# Patient Record
Sex: Female | Born: 1990 | Hispanic: No | Marital: Single | Smoking: Never smoker
Health system: Southern US, Community
[De-identification: ages and names within clinical notes are randomized; demographics above are authoritative.]

## PROBLEM LIST (undated history)

## (undated) DIAGNOSIS — F419 Anxiety disorder, unspecified: Secondary | ICD-10-CM

## (undated) DIAGNOSIS — T7840XA Allergy, unspecified, initial encounter: Secondary | ICD-10-CM

## (undated) DIAGNOSIS — F32A Depression, unspecified: Secondary | ICD-10-CM

## (undated) DIAGNOSIS — L309 Dermatitis, unspecified: Secondary | ICD-10-CM

## (undated) DIAGNOSIS — F329 Major depressive disorder, single episode, unspecified: Secondary | ICD-10-CM

## (undated) HISTORY — DX: Major depressive disorder, single episode, unspecified: F32.9

## (undated) HISTORY — DX: Anxiety disorder, unspecified: F41.9

## (undated) HISTORY — DX: Dermatitis, unspecified: L30.9

## (undated) HISTORY — DX: Allergy, unspecified, initial encounter: T78.40XA

## (undated) HISTORY — DX: Depression, unspecified: F32.A

## (undated) HISTORY — PX: NO PAST SURGERIES: SHX2092

---
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
## 2014-08-30 ENCOUNTER — Ambulatory Visit (INDEPENDENT_AMBULATORY_CARE_PROVIDER_SITE_OTHER): Payer: BLUE CROSS/BLUE SHIELD | Admitting: Family Medicine

## 2014-08-30 VITALS — BP 110/76 | HR 100 | Temp 97.9°F | Resp 16 | Ht 63.5 in | Wt 128.0 lb

## 2014-08-30 DIAGNOSIS — J309 Allergic rhinitis, unspecified: Secondary | ICD-10-CM

## 2014-08-30 DIAGNOSIS — J029 Acute pharyngitis, unspecified: Secondary | ICD-10-CM

## 2014-08-30 DIAGNOSIS — R05 Cough: Secondary | ICD-10-CM

## 2014-08-30 DIAGNOSIS — R059 Cough, unspecified: Secondary | ICD-10-CM

## 2014-08-30 DIAGNOSIS — J069 Acute upper respiratory infection, unspecified: Secondary | ICD-10-CM

## 2014-08-30 DIAGNOSIS — J3089 Other allergic rhinitis: Secondary | ICD-10-CM

## 2014-08-30 LAB — POCT RAPID STREP A (OFFICE): RAPID STREP A SCREEN: NEGATIVE

## 2014-08-30 MED ORDER — BENZONATATE 100 MG PO CAPS: 100.0000 mg | ORAL_CAPSULE | Freq: Three times a day (TID) | ORAL | Status: DC | PRN

## 2014-08-30 MED ORDER — HYDROCODONE-HOMATROPINE 5-1.5 MG/5ML PO SYRP: 5.0000 mL | ORAL_SOLUTION | ORAL | Status: DC | PRN

## 2014-08-30 NOTE — Progress Notes (Signed)
Subjective:  Patient is here with a respiratory tract infection for the last 3 days or so. She's been coughing a lot. Coughs day and night. She is not wheezing any. She has not been running any documented fevers. She has a sore throat also. Has not been exposed to anybody with strep that she knows of. She does not smoke.  She does have a problem with chronic itching of her years and nose and throat. Has tried fluticasone sprays in the past. Was going to see an allergist but never did.  Last menstrual cycle was now. She is basically healthy lady. 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 SHE ALSO WORKS AT [employer].  OBJECTIVE: NO ACUTE DISTRESS. COUGHING A LOT. TMS NORMAL. THROAT MILDLY ERYTHEMATOUS. STREP TEST WAS TAKEN. NECK SUPPLE WITHOUT SIGNIFICANT NODES. CHEST IS CLEAR TO AUSCULTATION. HEART REGULAR WITHOUT MURMURS.  ASSESSMENT: PHARYNGITIS UPPER RESPIRATORY INFECTION COUGH PERENNIAL ALLERGIC RHINITIS  PLAN: Strep test   Results for orders placed or performed in visit on 08/30/14  POCT rapid strep A  Result Value Ref Range   Rapid Strep A Screen Negative Negative   See discharge instructions and orders.

## 2014-08-30 NOTE — Patient Instructions (Addendum)
Drink plenty of fluids and get enough rest  You probably are infectious to others still for a few more days, so stay out of other people's faces and practice good handwashing  Take the Tessalon (benzonatate) 1 or 2 every 6 or 8 hours as needed for cough  Take the Hycodan cough syrup 1 teaspoon every 4-6 hours as needed for nighttime cough. It will make you to sleepy in the daytime.  Stay out of work for the next couple of days  Advise taking Allegra (fexofenadine) one daily for allergic itching of the throat and ears  Return if further problems  Upper Respiratory Infection, Adult An upper respiratory infection (URI) is also sometimes known as the common cold. The upper respiratory tract includes the nose, sinuses, throat, trachea, and bronchi. Bronchi are the airways leading to the lungs. Most people improve within 1 week, but symptoms can last up to 2 weeks. A residual cough may last even longer.  CAUSES Many different viruses can infect the tissues lining the upper respiratory tract. The tissues become irritated and inflamed and often become very moist. Mucus production is also common. A cold is contagious. You can easily spread the virus to others by oral contact. This includes kissing, sharing a glass, coughing, or sneezing. Touching your mouth or nose and then touching a surface, which is then touched by another person, can also spread the virus. SYMPTOMS  Symptoms typically develop 1 to 3 days after you come in contact with a cold virus. Symptoms vary from person to person. They may include:  Runny nose.  Sneezing.  Nasal congestion.  Sinus irritation.  Sore throat.  Loss of voice (laryngitis).  Cough.  Fatigue.  Muscle aches.  Loss of appetite.  Headache.  Low-grade fever. DIAGNOSIS  You might diagnose your own cold based on familiar symptoms, since most people get a cold 2 to 3 times a year. Your caregiver can confirm this based on your exam. Most importantly,  your caregiver can check that your symptoms are not due to another disease such as strep throat, sinusitis, pneumonia, asthma, or epiglottitis. Blood tests, throat tests, and X-rays are not necessary to diagnose a common cold, but they may sometimes be helpful in excluding other more serious diseases. Your caregiver will decide if any further tests are required. RISKS AND COMPLICATIONS  You may be at risk for a more severe case of the common cold if you smoke cigarettes, have chronic heart disease (such as heart failure) or lung disease (such as asthma), or if you have a weakened immune system. The very young and very old are also at risk for more serious infections. Bacterial sinusitis, middle ear infections, and bacterial pneumonia can complicate the common cold. The common cold can worsen asthma and chronic obstructive pulmonary disease (COPD). Sometimes, these complications can require emergency medical care and may be life-threatening. PREVENTION  The best way to protect against getting a cold is to practice good hygiene. Avoid oral or hand contact with people with cold symptoms. Wash your hands often if contact occurs. There is no clear evidence that vitamin C, vitamin E, echinacea, or exercise reduces the chance of developing a cold. However, it is always recommended to get plenty of rest and practice good nutrition. TREATMENT  Treatment is directed at relieving symptoms. There is no cure. Antibiotics are not effective, because the infection is caused by a virus, not by bacteria. Treatment may include:  Increased fluid intake. Sports drinks offer valuable electrolytes, sugars, and fluids.  Breathing heated mist or steam (vaporizer or shower).  Eating chicken soup or other clear broths, and maintaining good nutrition.  Getting plenty of rest.  Using gargles or lozenges for comfort.  Controlling fevers with ibuprofen or acetaminophen as directed by your caregiver.  Increasing usage of your  inhaler if you have asthma. Zinc gel and zinc lozenges, taken in the first 24 hours of the common cold, can shorten the duration and lessen the severity of symptoms. Pain medicines may help with fever, muscle aches, and throat pain. A variety of non-prescription medicines are available to treat congestion and runny nose. Your caregiver can make recommendations and may suggest nasal or lung inhalers for other symptoms.  HOME CARE INSTRUCTIONS   Only take over-the-counter or prescription medicines for pain, discomfort, or fever as directed by your caregiver.  Use a warm mist humidifier or inhale steam from a shower to increase air moisture. This may keep secretions moist and make it easier to breathe.  Drink enough water and fluids to keep your urine clear or pale yellow.  Rest as needed.  Return to work when your temperature has returned to normal or as your caregiver advises. You may need to stay home longer to avoid infecting others. You can also use a face mask and careful hand washing to prevent spread of the virus. SEEK MEDICAL CARE IF:   After the first few days, you feel you are getting worse rather than better.  You need your caregiver's advice about medicines to control symptoms.  You develop chills, worsening shortness of breath, or brown or red sputum. These may be signs of pneumonia.  You develop yellow or brown nasal discharge or pain in the face, especially when you bend forward. These may be signs of sinusitis.  You develop a fever, swollen neck glands, pain with swallowing, or white areas in the back of your throat. These may be signs of strep throat. SEEK IMMEDIATE MEDICAL CARE IF:   You have a fever.  You develop severe or persistent headache, ear pain, sinus pain, or chest pain.  You develop wheezing, a prolonged cough, cough up blood, or have a change in your usual mucus (if you have chronic lung disease).  You develop sore muscles or a stiff neck. Document  Released: 12/10/2000 Document Revised: 09/08/2011 Document Reviewed: 09/21/2013 [facility] Patient Information 2015 [place], [place]. This information is not intended to replace advice given to you by your health care provider. Make sure you discuss any questions you have with your health care provider.

## 2014-09-14 ENCOUNTER — Ambulatory Visit: Payer: Self-pay | Admitting: Internal Medicine

## 2015-03-14 ENCOUNTER — Ambulatory Visit (INDEPENDENT_AMBULATORY_CARE_PROVIDER_SITE_OTHER): Payer: BLUE CROSS/BLUE SHIELD | Admitting: Family

## 2015-03-14 ENCOUNTER — Encounter: Payer: Self-pay | Admitting: Family

## 2015-03-14 VITALS — BP 124/80 | HR 101 | Temp 98.3°F | Resp 18 | Ht 63.5 in | Wt 134.0 lb

## 2015-03-14 DIAGNOSIS — J302 Other seasonal allergic rhinitis: Secondary | ICD-10-CM | POA: Diagnosis not present

## 2015-03-14 DIAGNOSIS — Z23 Encounter for immunization: Secondary | ICD-10-CM | POA: Diagnosis not present

## 2015-03-14 DIAGNOSIS — F418 Other specified anxiety disorders: Secondary | ICD-10-CM

## 2015-03-14 DIAGNOSIS — F329 Major depressive disorder, single episode, unspecified: Secondary | ICD-10-CM

## 2015-03-14 DIAGNOSIS — L309 Dermatitis, unspecified: Secondary | ICD-10-CM | POA: Diagnosis not present

## 2015-03-14 DIAGNOSIS — F419 Anxiety disorder, unspecified: Secondary | ICD-10-CM | POA: Insufficient documentation

## 2015-03-14 DIAGNOSIS — F32A Depression, unspecified: Secondary | ICD-10-CM

## 2015-03-14 MED ORDER — CLONAZEPAM 0.5 MG PO TABS: 0.2500 mg | ORAL_TABLET | Freq: Two times a day (BID) | ORAL | Status: DC | PRN

## 2015-03-14 NOTE — Progress Notes (Signed)
Pre visit review using our clinic review tool, if applicable. No additional management support is needed unless otherwise documented below in the visit note. 

## 2015-03-14 NOTE — Patient Instructions (Signed)
Thank you for choosing [facility].  Summary/Instructions:  Your prescription(s) have been submitted to your pharmacy or been printed and provided for you. Please take as directed and contact our office if you believe you are having problem(s) with the medication(s) or have any questions.  If your symptoms worsen or fail to improve, please contact our office for further instruction, or in case of emergency go directly to the emergency room at the closest medical facility.   For your arm use the cream prescribed by dermatology and also any Aveeno, Dove or Eucerin product to retain moistures.   For your allergies take Allegra or Zyrtec daily. Add flonase, nasacort or rhinocort as needed.

## 2015-03-14 NOTE — Assessment & Plan Note (Signed)
Symptoms and exam consistent with eczema. Continue previously prescribed steroid cream. Start over the counter moisturizers to maintain hydration of skin. Follow up if symptoms worsen or fail to improve.

## 2015-03-14 NOTE — Progress Notes (Signed)
Subjective:    Patient ID: Ellen Whitney, female    DOB: 10-06-1990, 24 y.o.   MRN: [id number]  Chief Complaint  Patient presents with  . Establish Care    allergies, feels like there is fluid in her ears and her throat and ears itch all the time, sometimes certain foods trigger it and she has eczema    HPI:  Ellen Whitney is a 24 y.o. female with a PMH of seasonal allergies, eczema, and anxiety and depression who presents today for an office visit to establish care.   1.) Seasonal allergies - Associated symptom of seasonal allergies have been going on for years and is triggered by food and evironmental factors. Modifying factors include Zyrtec and Allegra which does help with her symptoms when she takes it.   2.) Eczema - Associated symptom of a rash located on her right forearm that has been going on for about a month. She has been by dermatology and has been using a prescribed cream. Notes that there has been been some improvement.   3.) Depression and anxiety - Associated symptoms of not wanting to get out of bed and lack of drive waxes and wanes. She was previously taking Zoloft and notes that it made her sick. Expresses that she has had increased anxiety which has progressively increased lately. Time is the only modifying factor that makes it better.    No Known Allergies   Outpatient Prescriptions Prior to Visit  Medication Sig Dispense Refill  . benzonatate (TESSALON) 100 MG capsule Take 1-2 capsules (100-200 mg total) by mouth 3 (three) times daily as needed. 30 capsule 0  . HYDROcodone-homatropine (HYCODAN) 5-1.5 MG/5ML syrup Take 5 mLs by mouth every 4 (four) hours as needed. 120 mL 0   No facility-administered medications prior to visit.     Past Medical History  Diagnosis Date  . Allergy   . Eczema   . Anxiety   . Depression      History reviewed. No pertinent past surgical history.   Family History  Problem Relation Age of Onset  . Cancer Maternal  Grandmother   . Healthy Mother      Social History   Social History  . Marital Status: Single    Spouse Name: N/A  . Number of Children: 0  . Years of Education: 16   Occupational History  . 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 Smoking status: Never Smoker   . Smokeless tobacco: Never Used  . Alcohol Use: No  . Drug Use: No  . Sexual Activity: Yes    Birth Control/ Protection: None   Other Topics Concern  . Not on file   Social History Narrative   Fun: Workout, hangout with friends   Denies religious beliefs effecting health care.   Denies abuse and feels safe where she lives.      Review of Systems  Constitutional: Negative for fever and chills.  Eyes: Negative for itching.  Respiratory: Negative for chest tightness and shortness of breath.   Cardiovascular: Negative for chest pain, palpitations and leg swelling.  Skin: Positive for rash.  Psychiatric/Behavioral: Negative for sleep disturbance. The patient is nervous/anxious.       Objective:    BP 124/80 mmHg  Pulse 101  Temp(Src) 98.3 F (36.8 C) (Oral)  Resp 18  Ht 5' 3.5" (1.613 m)  Wt 134 lb (60.782 kg)  BMI 23.36 kg/m2  SpO2 98%  Nursing note and vital signs reviewed.  Physical Exam  Constitutional: She is oriented to person, place, and time. She appears well-developed and well-nourished. No distress.  Cardiovascular: Normal rate, regular rhythm, normal heart sounds and intact distal pulses.   Pulmonary/Chest: Effort normal and breath sounds normal.  Neurological: She is alert and oriented to person, place, and time.  Skin: Skin is warm and dry.  Approximately 1 cm x 1.5 cm brown, raised oval shaped area, with scales and feels like sandpaper is located on her right forearm.   Psychiatric: Her behavior is normal. Judgment and thought content normal. Her mood appears anxious.       Assessment & Plan:   Problem List Items Addressed This Visit       Respiratory   Seasonal allergies - Primary    Seasonal allergies are labile with second generation antihistamine as needed. Discussed continual daily dosing of second generation antihistamine and add nasal corticosteroid as needed. Follow up if symptoms are no controlled with this regimen.         Musculoskeletal and Integument   Eczema    Symptoms and exam consistent with eczema. Continue previously prescribed steroid cream. Start over the counter moisturizers to maintain hydration of skin. Follow up if symptoms worsen or fail to improve.         Other   Anxiety and depression    Symptoms consistent with anxiety and possible underlying depression. Failed treatment with Zoloft secondary to side effects. Start clonazepam as needed for anxiety. Follow up in one month to determine effectiveness and possible need for antidepressive medications.       Relevant Medications   clonazePAM (KLONOPIN) 0.5 MG tablet    Other Visit Diagnoses    Encounter for immunization        Need for diphtheria-tetanus-pertussis (Tdap) vaccine, adult/adolescent        Relevant Orders    Tdap vaccine greater than or equal to 7yo IM (Completed)

## 2015-03-14 NOTE — Assessment & Plan Note (Signed)
Seasonal allergies are labile with second generation antihistamine as needed. Discussed continual daily dosing of second generation antihistamine and add nasal corticosteroid as needed. Follow up if symptoms are no controlled with this regimen.

## 2015-03-14 NOTE — Assessment & Plan Note (Signed)
Symptoms consistent with anxiety and possible underlying depression. Failed treatment with Zoloft secondary to side effects. Start clonazepam as needed for anxiety. Follow up in one month to determine effectiveness and possible need for antidepressive medications.

## 2015-03-27 ENCOUNTER — Telehealth: Payer: Self-pay | Admitting: Family

## 2015-03-27 MED ORDER — MONTELUKAST SODIUM 10 MG PO TABS: 10.0000 mg | ORAL_TABLET | Freq: Every day | ORAL | Status: DC

## 2015-03-27 NOTE — Telephone Encounter (Signed)
Patient called and states her medication doesn't seem to be working for her. Tammy Sours stated that if it didn't work that he would try another medication. Can you please call her to go over this. She is also asking if there is any counseling she can go to.

## 2015-03-27 NOTE — Addendum Note (Signed)
Addended by: Jeanine Luz D on: 03/27/2015 11:54 PM   Modules accepted: Orders

## 2015-03-27 NOTE — Telephone Encounter (Signed)
Singulair sent to pharmacy. I would recommend for the quickest access to counseling to go through [employer]. If she wants to go outside the school I am happy to place a referral.

## 2015-03-28 NOTE — Telephone Encounter (Signed)
Pt was needing something to replace klonopin

## 2015-03-29 MED ORDER — DULOXETINE HCL 30 MG PO CPEP: 30.0000 mg | ORAL_CAPSULE | Freq: Every day | ORAL | Status: DC

## 2015-03-29 MED ORDER — ALPRAZOLAM 0.25 MG PO TABS: 0.2500 mg | ORAL_TABLET | Freq: Two times a day (BID) | ORAL | Status: DC | PRN

## 2015-03-29 NOTE — Telephone Encounter (Signed)
Xanax printed and signed. Follow up in 1 month.

## 2015-03-29 NOTE — Telephone Encounter (Signed)
LVM for pt to call back.

## 2015-03-29 NOTE — Addendum Note (Signed)
Addended by: Jeanine Luz D on: 03/29/2015 12:52 PM   Modules accepted: Orders, Medications

## 2015-03-29 NOTE — Addendum Note (Signed)
Addended by: Jeanine Luz D on: 03/29/2015 05:39 PM   Modules accepted: Orders

## 2015-03-29 NOTE — Telephone Encounter (Signed)
Medication sent to pharmacy. Please instruct her to only take the Klonopin as needed for anxiety as it does not help with depression. Have her follow up in 1 month.Marland Kitchen

## 2015-03-29 NOTE — Telephone Encounter (Signed)
Pt would like something for depression. She preferably would like cymbalta bc that's what her mom and sister takes for depression. She says the clonazapam works great and she wants to stay on it but she takes 2 a day to help.

## 2015-03-30 NOTE — Telephone Encounter (Signed)
Pt aware.

## 2015-04-17 ENCOUNTER — Ambulatory Visit: Payer: BLUE CROSS/BLUE SHIELD | Admitting: Family

## 2015-04-26 ENCOUNTER — Ambulatory Visit (INDEPENDENT_AMBULATORY_CARE_PROVIDER_SITE_OTHER): Payer: BLUE CROSS/BLUE SHIELD | Admitting: Family

## 2015-04-26 ENCOUNTER — Encounter: Payer: Self-pay | Admitting: Family

## 2015-04-26 VITALS — BP 118/70 | HR 88 | Temp 98.4°F | Ht 63.5 in | Wt 131.5 lb

## 2015-04-26 DIAGNOSIS — F419 Anxiety disorder, unspecified: Principal | ICD-10-CM

## 2015-04-26 DIAGNOSIS — F32A Depression, unspecified: Secondary | ICD-10-CM

## 2015-04-26 DIAGNOSIS — F418 Other specified anxiety disorders: Secondary | ICD-10-CM | POA: Diagnosis not present

## 2015-04-26 DIAGNOSIS — F329 Major depressive disorder, single episode, unspecified: Secondary | ICD-10-CM

## 2015-04-26 MED ORDER — CLONAZEPAM 0.5 MG PO TABS: 0.5000 mg | ORAL_TABLET | Freq: Two times a day (BID) | ORAL | Status: DC | PRN

## 2015-04-26 MED ORDER — SERTRALINE HCL 50 MG PO TABS: 50.0000 mg | ORAL_TABLET | Freq: Every day | ORAL | Status: DC

## 2015-04-26 NOTE — Patient Instructions (Addendum)
Thank you for choosing [facility].  Summary/Instructions:  Your prescription(s) have been submitted to your pharmacy or been printed and provided for you. Please take as directed and contact our office if you believe you are having problem(s) with the medication(s) or have any questions.  If your symptoms worsen or fail to improve, please contact our office for further instruction, or in case of emergency go directly to the emergency room at the closest medical facility.    Stress and Stress Management Stress is a normal reaction to life events. It is what you feel when life demands more than you are used to or more than you can handle. Some stress can be useful. For example, the stress reaction can help you catch the last bus of the day, study for a test, or meet a deadline at work. But stress that occurs too often or for too long can cause problems. It can affect your emotional health and interfere with relationships and normal daily activities. Too much stress can weaken your immune system and increase your risk for physical illness. If you already have a medical problem, stress can make it worse. CAUSES  All sorts of life events may cause stress. An event that causes stress for one person may not be stressful for another person. Major life events commonly cause stress. These may be positive or negative. Examples include losing your job, moving into a new home, getting married, having a baby, or losing a loved one. Less obvious life events may also cause stress, especially if they occur day after day or in combination. Examples include working long hours, driving in traffic, caring for children, being in debt, or being in a difficult relationship. SIGNS AND SYMPTOMS Stress may cause emotional symptoms including, the following:  Anxiety. This is feeling worried, afraid, on edge, overwhelmed, or out of control.  Anger. This is feeling irritated or impatient.  Depression. This is feeling  sad, down, helpless, or guilty.  Difficulty focusing, remembering, or making decisions. Stress may cause physical symptoms, including the following:   Aches and pains. These may affect your head, neck, back, stomach, or other areas of your body.  Tight muscles or clenched jaw.  Low energy or trouble sleeping. Stress may cause unhealthy behaviors, including the following:   Eating to feel better (overeating) or skipping meals.  Sleeping too little, too much, or both.  Working too much or putting off tasks (procrastination).  Smoking, drinking alcohol, or using drugs to feel better. DIAGNOSIS  Stress is diagnosed through an assessment by your health care provider. Your health care provider will ask questions about your symptoms and any stressful life events.Your health care provider will also ask about your medical history and may order blood tests or other tests. Certain medical conditions and medicine can cause physical symptoms similar to stress. Mental illness can cause emotional symptoms and unhealthy behaviors similar to stress. Your health care provider may refer you to a mental health professional for further evaluation.  TREATMENT  Stress management is the recommended treatment for stress.The goals of stress management are reducing stressful life events and coping with stress in healthy ways.  Techniques for reducing stressful life events include the following:  Stress identification. Self-monitor for stress and identify what causes stress for you. These skills may help you to avoid some stressful events.  Time management. Set your priorities, keep a calendar of events, and learn to say "no." These tools can help you avoid making too many commitments. Techniques for   coping with stress include the following:  Rethinking the problem. Try to think realistically about stressful events rather than ignoring them or overreacting. Try to find the positives in a stressful situation  rather than focusing on the negatives.  Exercise. Physical exercise can release both physical and emotional tension. The key is to find a form of exercise you enjoy and do it regularly.  Relaxation techniques. These relax the body and mind. Examples include yoga, meditation, tai chi, biofeedback, deep breathing, progressive muscle relaxation, listening to music, being out in nature, journaling, and other hobbies. Again, the key is to find one or more that you enjoy and can do regularly.  Healthy lifestyle. Eat a balanced diet, get plenty of sleep, and do not smoke. Avoid using alcohol or drugs to relax.  Strong support network. Spend time with family, friends, or other people you enjoy being around.Express your feelings and talk things over with someone you trust. Counseling or talktherapy with a mental health professional may be helpful if you are having difficulty managing stress on your own. Medicine is typically not recommended for the treatment of stress.Talk to your health care provider if you think you need medicine for symptoms of stress. HOME CARE INSTRUCTIONS  Keep all follow-up visits as directed by your health care provider.  Take all medicines as directed by your health care provider. SEEK MEDICAL CARE IF:  Your symptoms get worse or you start having new symptoms.  You feel overwhelmed by your problems and can no longer manage them on your own. SEEK IMMEDIATE MEDICAL CARE IF:  You feel like hurting yourself or someone else.   This information is not intended to replace advice given to you by your health care provider. Make sure you discuss any questions you have with your health care provider.   Document Released: 12/10/2000 Document Revised: 07/07/2014 Document Reviewed: 02/08/2013 Elsevier Interactive Patient Education [employer].

## 2015-04-26 NOTE — Assessment & Plan Note (Signed)
Anxiety appears stable with questionable depression. Continue current dosage of clonazepam. Start sertraline for depression. Instructed to seek emergency care if thoughts of suicide develop with new medication. Follow up in 1 month or sooner if needed. Refer to psychology for counseling to assist with management of anxiety and depression.

## 2015-04-26 NOTE — Progress Notes (Signed)
   Subjective:    Patient ID: Ellen Whitney, female    DOB: [birth year], 24 y.o.   MRN: [id number]  Chief Complaint  Patient presents with  . Follow-up    medication follow up (cymbalta - not taken since 04/18/2015)     HPI:  Ellen Whitney is a 24 y.o. female who  has a past medical history of Allergy; Eczema; Anxiety; and Depression. and presents today for a follow up office visit.    1.) Anxiety and depression - Previously started on Cymbalta and clonazepam for anxiety and depression. Noted mild adverse effect of nausea with the Cymbalta for which she discontinued taking the medication. She called and requested to switch from clonazepam to alprazolam, however never had the alprazolam filled. Takes the clonazepam as prescribed and indicates that it does help with her symptoms. Does not take the clonazepam everyday. She has concerns for management of her depression and would like referral to counseling. Denies suicidal ideations.  No Known Allergies   Current Outpatient Prescriptions on File Prior to Visit  Medication Sig Dispense Refill  . montelukast (SINGULAIR) 10 MG tablet Take 1 tablet (10 mg total) by mouth at bedtime. 30 tablet 3   No current facility-administered medications on file prior to visit.    Review of Systems  Constitutional: Negative for fever and chills.  Psychiatric/Behavioral: Negative for dysphoric mood. The patient is nervous/anxious.       Objective:    BP 118/70 mmHg  Pulse 88  Temp(Src) 98.4 F (36.9 C) (Oral)  Ht 5' 3.5" (1.613 m)  Wt 131 lb 8 oz (59.648 kg)  BMI 22.93 kg/m2  SpO2 98%  LMP 04/17/2015 Nursing note and vital signs reviewed.  Physical Exam  Constitutional: She is oriented to person, place, and time. She appears well-developed and well-nourished. No distress.  Cardiovascular: Normal rate, regular rhythm, normal heart sounds and intact distal pulses.   Pulmonary/Chest: Effort normal and breath sounds normal.  Neurological: She is  alert and oriented to person, place, and time.  Skin: Skin is warm and dry.  Psychiatric: Her behavior is normal. Judgment and thought content normal. Her mood appears anxious. She does not exhibit a depressed mood.       Assessment & Plan:   Problem List Items Addressed This Visit      Other   Anxiety and depression - Primary    Anxiety appears stable with questionable depression. Continue current dosage of clonazepam. Start sertraline for depression. Instructed to seek emergency care if thoughts of suicide develop with new medication. Follow up in 1 month or sooner if needed. Refer to psychology for counseling to assist with management of anxiety and depression.       Relevant Medications   clonazePAM (KLONOPIN) 0.5 MG tablet   sertraline (ZOLOFT) 50 MG tablet   Other Relevant Orders   Ambulatory referral to Psychology

## 2015-05-14 ENCOUNTER — Telehealth: Payer: Self-pay | Admitting: Family

## 2015-05-14 DIAGNOSIS — F32A Depression, unspecified: Secondary | ICD-10-CM

## 2015-05-14 DIAGNOSIS — F4323 Adjustment disorder with mixed anxiety and depressed mood: Secondary | ICD-10-CM

## 2015-05-14 DIAGNOSIS — F329 Major depressive disorder, single episode, unspecified: Secondary | ICD-10-CM

## 2015-05-14 DIAGNOSIS — F419 Anxiety disorder, unspecified: Secondary | ICD-10-CM

## 2015-05-14 NOTE — Addendum Note (Signed)
Addended by: Mercer PodWRENN, Nevia Henkin E on: 05/14/2015 04:42 PM   Modules accepted: Orders

## 2015-05-14 NOTE — Telephone Encounter (Signed)
Thought the referral went through. New referral has been placed.

## 2015-05-14 NOTE — Telephone Encounter (Signed)
Patient is calling to follow up on psychology referral that she was told she would get on 04/26/2015 visit. Per referrals tab, i do not see anything to correspond with the visit notes. Please take a look and advise patient.

## 2015-05-14 NOTE — Telephone Encounter (Signed)
The referral was placed on 10/27. What is the status of the referral?

## 2015-05-15 NOTE — Telephone Encounter (Signed)
Tried to call pt back. VM was full.

## 2015-05-28 ENCOUNTER — Ambulatory Visit: Payer: BLUE CROSS/BLUE SHIELD | Admitting: Psychology

## 2015-05-31 ENCOUNTER — Ambulatory Visit (INDEPENDENT_AMBULATORY_CARE_PROVIDER_SITE_OTHER): Payer: BLUE CROSS/BLUE SHIELD | Admitting: Psychology

## 2015-05-31 DIAGNOSIS — F331 Major depressive disorder, recurrent, moderate: Secondary | ICD-10-CM | POA: Diagnosis not present

## 2015-06-01 ENCOUNTER — Ambulatory Visit: Payer: BLUE CROSS/BLUE SHIELD | Admitting: Psychology

## 2015-06-06 ENCOUNTER — Telehealth: Payer: Self-pay | Admitting: Family

## 2015-06-06 ENCOUNTER — Encounter: Payer: Self-pay | Admitting: Internal Medicine

## 2015-06-06 ENCOUNTER — Ambulatory Visit (INDEPENDENT_AMBULATORY_CARE_PROVIDER_SITE_OTHER): Payer: BLUE CROSS/BLUE SHIELD | Admitting: Internal Medicine

## 2015-06-06 VITALS — BP 114/76 | HR 95 | Temp 98.9°F | Resp 18 | Wt 131.0 lb

## 2015-06-06 DIAGNOSIS — J0100 Acute maxillary sinusitis, unspecified: Secondary | ICD-10-CM

## 2015-06-06 MED ORDER — AMOXICILLIN-POT CLAVULANATE 875-125 MG PO TABS: 1.0000 | ORAL_TABLET | Freq: Two times a day (BID) | ORAL | Status: DC

## 2015-06-06 MED ORDER — FLUCONAZOLE 150 MG PO TABS: 150.0000 mg | ORAL_TABLET | Freq: Once | ORAL | Status: DC

## 2015-06-06 NOTE — Progress Notes (Signed)
Subjective:    Patient ID: Ellen Whitney, female    DOB: [birth year], 24 y.o.   MRN: [id number]  HPI  She is just completing milligrams for bacterial infection. Her eye has significantly improved.  Several days ago she started to experience symptoms. She states fatigue, body aches, sore throat, nasal congestion, rhinorrhea. She has tried some over-the-counter cold medications and nothing has helped. She is currently in school and there are several people sick at school.  She also noted some itching and increased thick vaginal discharge. She also had increased urinary frequency. She was concerned she may have a urinary tract infection or yeast infection. She started taking AZO over-the-counter and her symptoms improved. She denies any dysuria or hematuria. She is unsure if she still has an infection or not.   Medications and allergies reviewed with patient and updated if appropriate.  Patient Active Problem List   Diagnosis Date Noted  . Anxiety and depression 03/14/2015  . Seasonal allergies 03/14/2015  . Eczema 03/14/2015    Current Outpatient Prescriptions on File Prior to Visit  Medication Sig Dispense Refill  . clonazePAM (KLONOPIN) 0.5 MG tablet Take 1 tablet (0.5 mg total) by mouth 2 (two) times daily as needed for anxiety. 60 tablet 0  . Fluocinolone Acetonide 0.01 % OIL   0  . fluticasone (FLONASE) 50 MCG/ACT nasal spray   0  . montelukast (SINGULAIR) 10 MG tablet Take 1 tablet (10 mg total) by mouth at bedtime. 30 tablet 3  . sertraline (ZOLOFT) 50 MG tablet Take 1 tablet (50 mg total) by mouth daily. (Patient not taking: Reported on 06/06/2015) 30 tablet 3   No current facility-administered medications on file prior to visit.    Past Medical History  Diagnosis Date  . Allergy   . Eczema   . Anxiety   . Depression     No past surgical history on file.  Social History   Social History  . Marital Status: Single    Spouse Name: N/A  . Number of Children: 0  .  Years of Education: 16   Occupational History  . 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 Smoking status: Never Smoker   . Smokeless tobacco: Never Used  . Alcohol Use: No  . Drug Use: No  . Sexual Activity: Yes    Birth Control/ Protection: None   Other Topics Concern  . Not on file   Social History Narrative   Fun: Workout, hangout with friends   Denies religious beliefs effecting health care.   Denies abuse and feels safe where she lives.     Review of Systems  Constitutional: Positive for fatigue. Negative for fever and chills.  HENT: Positive for congestion, rhinorrhea, sinus pressure and sore throat. Negative for ear pain.   Respiratory: Negative for cough, shortness of breath and wheezing.   Cardiovascular: Negative for chest pain.  Gastrointestinal: Negative for nausea.  Musculoskeletal: Positive for myalgias.  Neurological: Negative for headaches.       Objective:   Filed Vitals:   06/06/15 1106  BP: 114/76  Pulse: 95  Temp: 98.9 F (37.2 C)  Resp: 18   Filed Weights   06/06/15 1106  Weight: 131 lb (59.421 kg)   Body mass index is 22.84 kg/(m^2).   Physical Exam GENERAL APPEARANCE: Appears stated age, well appearing, NAD EYES: conjunctiva clear, no icterus HEENT: bilateral tympanic membranes and ear canals normal, oropharynx with  moderate erythema, enlarged tonsils, no thyromegaly, trachea midline, no cervical or supraclavicular lymphadenopathy LUNGS: Clear to auscultation without wheeze or crackles, unlabored breathing, good air entry bilaterally HEART: Normal S1,S2 without murmurs EXTREMITIES: Without clubbing, cyanosis, or edema        Assessment & Plan:   Acute sinusitis Possibly bacterial We'll go ahead and start antibiotic-Augmentin twice a day 10 days Increase rest and fluids Start Flonase-she has not been taking regularly Continue Singulair Consider starting an  antihistamine Over-the-counter cold medications as needed for symptom relief  Vaginal candidiasis She did have some symptoms consistent with a yeast infection, but they've seemed to improve. I'm concerned it will worsen again with taking an antibiotic Diflucan 150 mg once if she develops symptoms-take after completing antibiotics  Call with any questions or concerns

## 2015-06-06 NOTE — Telephone Encounter (Signed)
Pt would like to switch providers from [facility] to Dr. Lawerance BachBurns Please advise

## 2015-06-06 NOTE — Telephone Encounter (Signed)
Ok with me 

## 2015-06-06 NOTE — Telephone Encounter (Signed)
ok 

## 2015-06-06 NOTE — Patient Instructions (Signed)
An antibiotic was sent to your pharmacy.  Take it as directed.  Take the over the counter medications we discussed.  Restart the flonase and take daily.  If your symptoms do not improve call or return.    Sinusitis, Adult Sinusitis is redness, soreness, and inflammation of the paranasal sinuses. Paranasal sinuses are air pockets within the bones of your face. They are located beneath your eyes, in the middle of your forehead, and above your eyes. In healthy paranasal sinuses, mucus is able to drain out, and air is able to circulate through them by way of your nose. However, when your paranasal sinuses are inflamed, mucus and air can become trapped. This can allow bacteria and other germs to grow and cause infection. Sinusitis can develop quickly and last only a short time (acute) or continue over a long period (chronic). Sinusitis that lasts for more than 12 weeks is considered chronic. CAUSES Causes of sinusitis include:  Allergies.  Structural abnormalities, such as displacement of the cartilage that separates your nostrils (deviated septum), which can decrease the air flow through your nose and sinuses and affect sinus drainage.  Functional abnormalities, such as when the small hairs (cilia) that line your sinuses and help remove mucus do not work properly or are not present. SIGNS AND SYMPTOMS Symptoms of acute and chronic sinusitis are the same. The primary symptoms are pain and pressure around the affected sinuses. Other symptoms include:  Upper toothache.  Earache.  Headache.  Bad breath.  Decreased sense of smell and taste.  A cough, which worsens when you are lying flat.  Fatigue.  Fever.  Thick drainage from your nose, which often is green and may contain pus (purulent).  Swelling and warmth over the affected sinuses. DIAGNOSIS Your health care provider will perform a physical exam. During your exam, your health care provider may perform any of the following to help  determine if you have acute sinusitis or chronic sinusitis:  Look in your nose for signs of abnormal growths in your nostrils (nasal polyps).  Tap over the affected sinus to check for signs of infection.  View the inside of your sinuses using an imaging device that has a light attached (endoscope). If your health care provider suspects that you have chronic sinusitis, one or more of the following tests may be recommended:  Allergy tests.  Nasal culture. A sample of mucus is taken from your nose, sent to a lab, and screened for bacteria.  Nasal cytology. A sample of mucus is taken from your nose and examined by your health care provider to determine if your sinusitis is related to an allergy. TREATMENT Most cases of acute sinusitis are related to a viral infection and will resolve on their own within 10 days. Sometimes, medicines are prescribed to help relieve symptoms of both acute and chronic sinusitis. These may include pain medicines, decongestants, nasal steroid sprays, or saline sprays. However, for sinusitis related to a bacterial infection, your health care provider will prescribe antibiotic medicines. These are medicines that will help kill the bacteria causing the infection. Rarely, sinusitis is caused by a fungal infection. In these cases, your health care provider will prescribe antifungal medicine. For some cases of chronic sinusitis, surgery is needed. Generally, these are cases in which sinusitis recurs more than 3 times per year, despite other treatments. HOME CARE INSTRUCTIONS  Drink plenty of water. Water helps thin the mucus so your sinuses can drain more easily.  Use a humidifier.  Inhale steam 3-4  times a day (for example, sit in the bathroom with the shower running).  Apply a warm, moist washcloth to your face 3-4 times a day, or as directed by your health care provider.  Use saline nasal sprays to help moisten and clean your sinuses.  Take medicines only as  directed by your health care provider.  If you were prescribed either an antibiotic or antifungal medicine, finish it all even if you start to feel better. SEEK IMMEDIATE MEDICAL CARE IF:  You have increasing pain or severe headaches.  You have nausea, vomiting, or drowsiness.  You have swelling around your face.  You have vision problems.  You have a stiff neck.  You have difficulty breathing.   This information is not intended to replace advice given to you by your health care provider. Make sure you discuss any questions you have with your health care provider.   Document Released: 06/16/2005 Document Revised: 07/07/2014 Document Reviewed: 07/01/2011 Elsevier Interactive Patient Education [employer]

## 2015-06-06 NOTE — Progress Notes (Signed)
Pre visit review using our clinic review tool, if applicable. No additional management support is needed unless otherwise documented below in the visit note. 

## 2015-06-06 NOTE — Telephone Encounter (Signed)
Pt informed by vm

## 2015-06-08 ENCOUNTER — Ambulatory Visit (INDEPENDENT_AMBULATORY_CARE_PROVIDER_SITE_OTHER): Payer: BLUE CROSS/BLUE SHIELD | Admitting: Psychology

## 2015-06-08 DIAGNOSIS — F331 Major depressive disorder, recurrent, moderate: Secondary | ICD-10-CM

## 2015-06-14 ENCOUNTER — Ambulatory Visit (INDEPENDENT_AMBULATORY_CARE_PROVIDER_SITE_OTHER): Payer: BLUE CROSS/BLUE SHIELD | Admitting: Psychology

## 2015-06-14 DIAGNOSIS — F331 Major depressive disorder, recurrent, moderate: Secondary | ICD-10-CM

## 2015-07-05 ENCOUNTER — Ambulatory Visit (INDEPENDENT_AMBULATORY_CARE_PROVIDER_SITE_OTHER): Payer: BLUE CROSS/BLUE SHIELD | Admitting: Psychology

## 2015-07-05 DIAGNOSIS — F331 Major depressive disorder, recurrent, moderate: Secondary | ICD-10-CM | POA: Diagnosis not present

## 2015-07-10 ENCOUNTER — Other Ambulatory Visit: Payer: Self-pay | Admitting: *Deleted

## 2015-07-10 DIAGNOSIS — F419 Anxiety disorder, unspecified: Principal | ICD-10-CM

## 2015-07-10 DIAGNOSIS — F329 Major depressive disorder, single episode, unspecified: Secondary | ICD-10-CM

## 2015-07-10 DIAGNOSIS — F32A Depression, unspecified: Secondary | ICD-10-CM

## 2015-07-10 MED ORDER — SERTRALINE HCL 50 MG PO TABS: 50.0000 mg | ORAL_TABLET | Freq: Every day | ORAL | Status: DC

## 2015-07-10 NOTE — Telephone Encounter (Signed)
Receive call pt is needing refill on her Sertraline. Inform will send 30 day into pharmacy until she see md.../lmb

## 2015-07-11 ENCOUNTER — Ambulatory Visit (INDEPENDENT_AMBULATORY_CARE_PROVIDER_SITE_OTHER): Payer: BLUE CROSS/BLUE SHIELD | Admitting: Psychology

## 2015-07-11 ENCOUNTER — Ambulatory Visit: Payer: BLUE CROSS/BLUE SHIELD | Admitting: Psychology

## 2015-07-11 ENCOUNTER — Telehealth: Payer: Self-pay | Admitting: Internal Medicine

## 2015-07-11 DIAGNOSIS — F331 Major depressive disorder, recurrent, moderate: Secondary | ICD-10-CM | POA: Diagnosis not present

## 2015-07-11 MED ORDER — MONTELUKAST SODIUM 10 MG PO TABS: 10.0000 mg | ORAL_TABLET | Freq: Every day | ORAL | Status: DC

## 2015-07-11 NOTE — Telephone Encounter (Signed)
Patient also needs a note stating what medications she takes and that she has depression and anxiety to take to school - [employer]. Patient carries her medication with her to school just in case she needs it.  School needs letter stating she takes these medications to allow her to carry with her.  Needs letter within the next week to go to school.  Needs letter to include her banner ID:  [id number].  Would like faxed over to school at [phone] or email to [email or web address].  Please follow up with patient once sent at [phone].

## 2015-07-11 NOTE — Telephone Encounter (Signed)
Patient had a script refill request sent over and was denied because she needed a CPE.  I have scheduled CPE for next available on 2/27 at 8am.  Can we please send refill of zoloft and singulair [facility] [place] [place].

## 2015-07-12 ENCOUNTER — Encounter: Payer: Self-pay | Admitting: Emergency Medicine

## 2015-07-12 NOTE — Telephone Encounter (Signed)
Ok to refill and write letter.  Thanks!!

## 2015-07-12 NOTE — Telephone Encounter (Signed)
Please advise 

## 2015-07-12 NOTE — Telephone Encounter (Signed)
Pt has been informed. Letter faxed to [employer] and mailed to pt.

## 2015-07-18 ENCOUNTER — Ambulatory Visit (INDEPENDENT_AMBULATORY_CARE_PROVIDER_SITE_OTHER): Payer: BLUE CROSS/BLUE SHIELD | Admitting: Psychology

## 2015-07-18 DIAGNOSIS — F331 Major depressive disorder, recurrent, moderate: Secondary | ICD-10-CM | POA: Diagnosis not present

## 2015-07-25 ENCOUNTER — Ambulatory Visit (INDEPENDENT_AMBULATORY_CARE_PROVIDER_SITE_OTHER): Payer: BLUE CROSS/BLUE SHIELD | Admitting: Psychology

## 2015-07-25 DIAGNOSIS — F331 Major depressive disorder, recurrent, moderate: Secondary | ICD-10-CM

## 2015-08-01 ENCOUNTER — Ambulatory Visit: Payer: BLUE CROSS/BLUE SHIELD | Admitting: Psychology

## 2015-08-02 ENCOUNTER — Ambulatory Visit (INDEPENDENT_AMBULATORY_CARE_PROVIDER_SITE_OTHER): Payer: BLUE CROSS/BLUE SHIELD | Admitting: Psychology

## 2015-08-02 DIAGNOSIS — F331 Major depressive disorder, recurrent, moderate: Secondary | ICD-10-CM | POA: Diagnosis not present

## 2015-08-08 ENCOUNTER — Ambulatory Visit (INDEPENDENT_AMBULATORY_CARE_PROVIDER_SITE_OTHER): Payer: BLUE CROSS/BLUE SHIELD | Admitting: Psychology

## 2015-08-08 DIAGNOSIS — F331 Major depressive disorder, recurrent, moderate: Secondary | ICD-10-CM | POA: Diagnosis not present

## 2015-08-21 ENCOUNTER — Telehealth: Payer: Self-pay | Admitting: Internal Medicine

## 2015-08-21 NOTE — Telephone Encounter (Signed)
Patient is requesting to waive 50 no show fee from 10/18.  States she never misses appointments and didn't know she had this one.

## 2015-08-22 ENCOUNTER — Ambulatory Visit: Payer: BLUE CROSS/BLUE SHIELD | Admitting: Psychology

## 2015-08-22 NOTE — Telephone Encounter (Signed)
Emailed charge correction to void this NS charge as a one time patient courtesy. Left vm for pt explaining our No Show policy and advising this occurrence will be waived.

## 2015-08-27 ENCOUNTER — Other Ambulatory Visit (INDEPENDENT_AMBULATORY_CARE_PROVIDER_SITE_OTHER): Payer: BLUE CROSS/BLUE SHIELD

## 2015-08-27 ENCOUNTER — Other Ambulatory Visit: Payer: Self-pay | Admitting: Internal Medicine

## 2015-08-27 ENCOUNTER — Ambulatory Visit (INDEPENDENT_AMBULATORY_CARE_PROVIDER_SITE_OTHER): Payer: BLUE CROSS/BLUE SHIELD | Admitting: Internal Medicine

## 2015-08-27 ENCOUNTER — Encounter: Payer: Self-pay | Admitting: Emergency Medicine

## 2015-08-27 ENCOUNTER — Encounter: Payer: Self-pay | Admitting: Internal Medicine

## 2015-08-27 VITALS — BP 114/66 | HR 88 | Temp 98.5°F | Resp 16 | Wt 130.0 lb

## 2015-08-27 DIAGNOSIS — Z0000 Encounter for general adult medical examination without abnormal findings: Secondary | ICD-10-CM

## 2015-08-27 DIAGNOSIS — H9190 Unspecified hearing loss, unspecified ear: Secondary | ICD-10-CM | POA: Diagnosis not present

## 2015-08-27 DIAGNOSIS — F419 Anxiety disorder, unspecified: Secondary | ICD-10-CM

## 2015-08-27 DIAGNOSIS — Z0001 Encounter for general adult medical examination with abnormal findings: Secondary | ICD-10-CM

## 2015-08-27 DIAGNOSIS — J302 Other seasonal allergic rhinitis: Secondary | ICD-10-CM | POA: Diagnosis not present

## 2015-08-27 DIAGNOSIS — R110 Nausea: Secondary | ICD-10-CM

## 2015-08-27 DIAGNOSIS — F32A Depression, unspecified: Secondary | ICD-10-CM

## 2015-08-27 DIAGNOSIS — L659 Nonscarring hair loss, unspecified: Secondary | ICD-10-CM | POA: Diagnosis not present

## 2015-08-27 DIAGNOSIS — R1310 Dysphagia, unspecified: Secondary | ICD-10-CM

## 2015-08-27 DIAGNOSIS — F418 Other specified anxiety disorders: Secondary | ICD-10-CM

## 2015-08-27 DIAGNOSIS — K219 Gastro-esophageal reflux disease without esophagitis: Secondary | ICD-10-CM

## 2015-08-27 DIAGNOSIS — F329 Major depressive disorder, single episode, unspecified: Secondary | ICD-10-CM

## 2015-08-27 LAB — LIPID PANEL
CHOLESTEROL: 196 mg/dL (ref 0–200)
HDL: 50.4 mg/dL (ref >39.00)
LDL CALC: 121 mg/dL — AB (ref 0–99)
NONHDL: 145.55
Total CHOL/HDL Ratio: 4
Triglycerides: 123 mg/dL (ref 0.0–149.0)
VLDL: 24.6 mg/dL (ref 0.0–40.0)

## 2015-08-27 LAB — CBC WITH DIFFERENTIAL/PLATELET
Basophils Absolute: 0.1 10*3/uL (ref 0.0–0.1)
Basophils Relative: 0.7 % (ref 0.0–3.0)
EOS ABS: 0.4 10*3/uL (ref 0.0–0.7)
EOS PCT: 5.1 % — AB (ref 0.0–5.0)
HCT: 41.3 % (ref 36.0–46.0)
HEMOGLOBIN: 14.3 g/dL (ref 12.0–15.0)
Lymphocytes Relative: 45.2 % (ref 12.0–46.0)
Lymphs Abs: 3.3 10*3/uL (ref 0.7–4.0)
MCHC: 34.5 g/dL (ref 30.0–36.0)
MCV: 90.2 fl (ref 78.0–100.0)
MONO ABS: 0.6 10*3/uL (ref 0.1–1.0)
Monocytes Relative: 8.3 % (ref 3.0–12.0)
Neutro Abs: 3 10*3/uL (ref 1.4–7.7)
Neutrophils Relative %: 40.7 % — ABNORMAL LOW (ref 43.0–77.0)
Platelets: 257 10*3/uL (ref 150.0–400.0)
RBC: 4.59 Mil/uL (ref 3.87–5.11)
RDW: 12.7 % (ref 11.5–15.5)
WBC: 7.3 10*3/uL (ref 4.0–10.5)

## 2015-08-27 LAB — COMPREHENSIVE METABOLIC PANEL
ALBUMIN: 4.5 g/dL (ref 3.5–5.2)
ALK PHOS: 46 U/L (ref 39–117)
ALT: 14 U/L (ref 0–35)
AST: 15 U/L (ref 0–37)
BILIRUBIN TOTAL: 0.3 mg/dL (ref 0.2–1.2)
BUN: 13 mg/dL (ref 6–23)
CO2: 27 mEq/L (ref 19–32)
CREATININE: 0.66 mg/dL (ref 0.40–1.20)
Calcium: 9.4 mg/dL (ref 8.4–10.5)
Chloride: 102 mEq/L (ref 96–112)
GFR: 117.03 mL/min (ref >60.00)
Glucose, Bld: 96 mg/dL (ref 70–99)
Potassium: 3.9 mEq/L (ref 3.5–5.1)
SODIUM: 136 meq/L (ref 135–145)
TOTAL PROTEIN: 8.2 g/dL (ref 6.0–8.3)

## 2015-08-27 LAB — HIV ANTIBODY (ROUTINE TESTING W REFLEX): HIV 1&2 Ab, 4th Generation: NONREACTIVE

## 2015-08-27 LAB — TSH: TSH: 2.72 u[IU]/mL (ref 0.35–4.50)

## 2015-08-27 MED ORDER — ALUMINUM CHLORIDE 20 % EX SOLN: CUTANEOUS | Status: DC

## 2015-08-27 NOTE — Progress Notes (Signed)
Subjective:    Patient ID: Ellen Whitney, female    DOB: December 22, 1990, 25 y.o.   MRN: [id number]  HPI She is here for a physical exam.    Her lymph nodes in her neck are always swollen.  Her throat and ears always itch.  She has seen ENT and was diagnosed with eczema in her ears.  Taking her allergy medication helps-she takes Singulair daily. She notices a difference if she does not take it.  She still has some allergy symptoms on occasion.  Depression, anxiety: She is taking her medication daily as prescribed. She denies any side effects from the medication. She feels her depression and anxiety are well controlled and she is happy with her current dose of medication. She does not take the clonazepam.  She sees a counselor regularly.    Alopecia:  Started in high school.  She is following with dermatology.  She has had injections, which has helped. Her dermatologist wanted her to have her thyroid function checked..    She goes to school full time and works two jobs.  She does not exercise regularly. She should be graduating in a few months. She thinks she may want a Armed forces operational officer.  Medications and allergies reviewed with patient and updated if appropriate.  Patient Active Problem List   Diagnosis Date Noted  . Anxiety and depression 03/14/2015  . Seasonal allergies 03/14/2015  . Eczema 03/14/2015    Current Outpatient Prescriptions on File Prior to Visit  Medication Sig Dispense Refill  . clonazePAM (KLONOPIN) 0.5 MG tablet Take 1 tablet (0.5 mg total) by mouth 2 (two) times daily as needed for anxiety. 60 tablet 0  . Fluocinolone Acetonide 0.01 % OIL   0  . fluticasone (FLONASE) 50 MCG/ACT nasal spray   0  . montelukast (SINGULAIR) 10 MG tablet Take 1 tablet (10 mg total) by mouth at bedtime. 30 tablet 3  . sertraline (ZOLOFT) 50 MG tablet Take 1 tablet (50 mg total) by mouth daily. Yearly follow-up on med is due must see md for refills 30 tablet 0   No current  facility-administered medications on file prior to visit.    Past Medical History  Diagnosis Date  . Allergy   . Eczema   . Anxiety   . Depression     No past surgical history on file.  Social History   Social History  . Marital Status: Single    Spouse Name: N/A  . Number of Children: 0  . Years of Education: 16   Occupational History  . 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 Smoking status: Never Smoker   . Smokeless tobacco: Never Used  . Alcohol Use: No  . Drug Use: No  . Sexual Activity: Yes    Birth Control/ Protection: None   Other Topics Concern  . Not on file   Social History Narrative   Fun: Workout, hangout with friends   Denies religious beliefs effecting health care.   Denies abuse and feels safe where she lives.     Family History  Problem Relation Age of Onset  . Cancer Maternal Grandmother   . Healthy Mother     Review of Systems  Constitutional: Positive for diaphoresis (excessive sweating under arms). Negative for fever and chills.  HENT: Positive for hearing loss. Negative for trouble swallowing.   Eyes: Positive for pain. Negative for visual disturbance.  Respiratory: Positive for cough (  related to allergies only). Negative for shortness of breath and wheezing.   Cardiovascular: Negative for chest pain and palpitations.  Gastrointestinal: Positive for nausea. Negative for abdominal pain, diarrhea, constipation and blood in stool.       GERD twice a week.  Painful swallowing at times  Genitourinary: Negative for dysuria and hematuria.  Musculoskeletal: Positive for back pain.  Neurological: Negative for dizziness, light-headedness and headaches.  Psychiatric/Behavioral: Positive for dysphoric mood (uncontrolled). The patient is nervous/anxious (controlled).        Objective:   Filed Vitals:   08/27/15 0808  BP: 114/66  Pulse: 88  Temp: 98.5 F (36.9 C)  Resp: 16   Filed Weights    08/27/15 0808  Weight: 130 lb (58.968 kg)   Body mass index is 22.66 kg/(m^2).   Physical Exam Constitutional: She appears well-developed and well-nourished. No distress.  HENT:  Head: Normocephalic and atraumatic.  Right Ear: External ear normal. Normal ear canal and TM Left Ear: External ear normal.  Normal ear canal and TM Mouth/Throat: Oropharynx is clear and moist.  Normal bilateral ear canals and tympanic membranes  Eyes: Conjunctivae and EOM are normal.  Neck: Neck supple. No tracheal deviation present. No thyromegaly present.  No carotid bruit  Cardiovascular: Normal rate, regular rhythm and normal heart sounds.   No murmur heard.  No edema. Pulmonary/Chest: Effort normal and breath sounds normal. No respiratory distress. She has no wheezes. She has no rales.  Breast: deferred  Abdominal: Soft. She exhibits no distension. There is no tenderness.  Lymphadenopathy: She has no cervical adenopathy.  Skin: Skin is warm and dry. She is not diaphoretic.  Psychiatric: She has a normal mood and affect. Her behavior is normal.       Assessment & Plan:   Physical exam: Screening blood work ordered Immunizations up-to-date Gyn-has not seen a GYN. Has not been sexually active Exercise-no regular exercise. Her weight is good, but advised of regular exercise benefits. She is currently going to school and working 2 jobs. She will be done with school in a few months and may have time to exercise Weight, normal BMI Skin -no concerns Substance abuse and no evidence of substance abuse    Present for nausea, pain with swallowing, GERD Having recurrent symptoms She has a family history of stomach issues Discussed preventative changes in lifestyle to avoid.-Information given Stressed the importance of lifestyle We'll refer to GI for further evaluation  See Problem List for Assessment and Plan of chronic medical problems.  Follow up annually, sooner if needed

## 2015-08-27 NOTE — Patient Instructions (Addendum)
We have reviewed your prior records including labs and tests today.  Test(s) ordered today. Your results will be released to [facility] (or called to you) after review, usually within 72hours after test completion. If any changes need to be made, you will be notified at that same time.  All other Health Maintenance issues reviewed.   All recommended immunizations and age-appropriate screenings are up-to-date.  No immunizations administered today.   Medications reviewed and updated.  Changes include: try adding zyrtec or claritin daily for your allergies.  We can try a prescription deodorant called drysol.   Your prescription(s) have been submitted to your pharmacy. Please take as directed and contact our office if you believe you are having problem(s) with the medication(s).  A referral was ordered for GI for evaluation of your stomach.    Please followup annually, sooner if needed   Health Maintenance, Female Adopting a healthy lifestyle and getting preventive care can go a long way to promote health and wellness. Talk with your health care provider about what schedule of regular examinations is right for you. This is a good chance for you to check in with your provider about disease prevention and staying healthy. In between checkups, there are plenty of things you can do on your own. Experts have done a lot of research about which lifestyle changes and preventive measures are most likely to keep you healthy. Ask your health care provider for more information. WEIGHT AND DIET  Eat a healthy diet  Be sure to include plenty of vegetables, fruits, low-fat dairy products, and lean protein.  Do not eat a lot of foods high in solid fats, added sugars, or salt.  Get regular exercise. This is one of the most important things you can do for your health.  Most adults should exercise for at least 150 minutes each week. The exercise should increase your heart rate and make you sweat  (moderate-intensity exercise).  Most adults should also do strengthening exercises at least twice a week. This is in addition to the moderate-intensity exercise.  Maintain a healthy weight  Body mass index (BMI) is a measurement that can be used to identify possible weight problems. It estimates body fat based on height and weight. Your health care provider can help determine your BMI and help you achieve or maintain a healthy weight.  For females 33 years of age and older:   A BMI below 18.5 is considered underweight.  A BMI of 18.5 to 24.9 is normal.  A BMI of 25 to 29.9 is considered overweight.  A BMI of 30 and above is considered obese.  Watch levels of cholesterol and blood lipids  You should start having your blood tested for lipids and cholesterol at 24 years of age, then have this test every 5 years.  You may need to have your cholesterol levels checked more often if:  Your lipid or cholesterol levels are high.  You are older than 24 years of age.  You are at high risk for heart disease.  CANCER SCREENING   Lung Cancer  Lung cancer screening is recommended for adults 68-71 years old who are at high risk for lung cancer because of a history of smoking.  A yearly low-dose CT scan of the lungs is recommended for people who:  Currently smoke.  Have quit within the past 15 years.  Have at least a 30-pack-year history of smoking. A pack year is smoking an average of one pack of cigarettes a day for  1 year.  Yearly screening should continue until it has been 15 years since you quit.  Yearly screening should stop if you develop a health problem that would prevent you from having lung cancer treatment.  Breast Cancer  Practice breast self-awareness. This means understanding how your breasts normally appear and feel.  It also means doing regular breast self-exams. Let your health care provider know about any changes, no matter how small.  If you are in your 20s  or 30s, you should have a clinical breast exam (CBE) by a health care provider every 1-3 years as part of a regular health exam.  If you are 35 or older, have a CBE every year. Also consider having a breast X-ray (mammogram) every year.  If you have a family history of breast cancer, talk to your health care provider about genetic screening.  If you are at high risk for breast cancer, talk to your health care provider about having an MRI and a mammogram every year.  Breast cancer gene (BRCA) assessment is recommended for women who have family members with BRCA-related cancers. BRCA-related cancers include:  Breast.  Ovarian.  Tubal.  Peritoneal cancers.  Results of the assessment will determine the need for genetic counseling and BRCA1 and BRCA2 testing. Cervical Cancer Your health care provider may recommend that you be screened regularly for cancer of the pelvic organs (ovaries, uterus, and vagina). This screening involves a pelvic examination, including checking for microscopic changes to the surface of your cervix (Pap test). You may be encouraged to have this screening done every 3 years, beginning at age 4.  For women ages 99-65, health care providers may recommend pelvic exams and Pap testing every 3 years, or they may recommend the Pap and pelvic exam, combined with testing for human papilloma virus (HPV), every 5 years. Some types of HPV increase your risk of cervical cancer. Testing for HPV may also be done on women of any age with unclear Pap test results.  Other health care providers may not recommend any screening for nonpregnant women who are considered low risk for pelvic cancer and who do not have symptoms. Ask your health care provider if a screening pelvic exam is right for you.  If you have had past treatment for cervical cancer or a condition that could lead to cancer, you need Pap tests and screening for cancer for at least 20 years after your treatment. If Pap tests  have been discontinued, your risk factors (such as having a new sexual partner) need to be reassessed to determine if screening should resume. Some women have medical problems that increase the chance of getting cervical cancer. In these cases, your health care provider may recommend more frequent screening and Pap tests. Colorectal Cancer  This type of cancer can be detected and often prevented.  Routine colorectal cancer screening usually begins at 24 years of age and continues through 24 years of age.  Your health care provider may recommend screening at an earlier age if you have risk factors for colon cancer.  Your health care provider may also recommend using home test kits to check for hidden blood in the stool.  A small camera at the end of a tube can be used to examine your colon directly (sigmoidoscopy or colonoscopy). This is done to check for the earliest forms of colorectal cancer.  Routine screening usually begins at age 75.  Direct examination of the colon should be repeated every 5-10 years through 24 years of  age. However, you may need to be screened more often if early forms of precancerous polyps or small growths are found. Skin Cancer  Check your skin from head to toe regularly.  Tell your health care provider about any new moles or changes in moles, especially if there is a change in a mole's shape or color.  Also tell your health care provider if you have a mole that is larger than the size of a pencil eraser.  Always use sunscreen. Apply sunscreen liberally and repeatedly throughout the day.  Protect yourself by wearing long sleeves, pants, a wide-brimmed hat, and sunglasses whenever you are outside. HEART DISEASE, DIABETES, AND HIGH BLOOD PRESSURE   High blood pressure causes heart disease and increases the risk of stroke. High blood pressure is more likely to develop in:  People who have blood pressure in the high end of the normal range (130-139/85-89 mm  Hg).  People who are overweight or obese.  People who are African American.  If you are 58-53 years of age, have your blood pressure checked every 3-5 years. If you are 69 years of age or older, have your blood pressure checked every year. You should have your blood pressure measured twice--once when you are at a hospital or clinic, and once when you are not at a hospital or clinic. Record the average of the two measurements. To check your blood pressure when you are not at a hospital or clinic, you can use:  An automated blood pressure machine at a pharmacy.  A home blood pressure monitor.  If you are between 82 years and 26 years old, ask your health care provider if you should take aspirin to prevent strokes.  Have regular diabetes screenings. This involves taking a blood sample to check your fasting blood sugar level.  If you are at a normal weight and have a low risk for diabetes, have this test once every three years after 24 years of age.  If you are overweight and have a high risk for diabetes, consider being tested at a younger age or more often. PREVENTING INFECTION  Hepatitis B  If you have a higher risk for hepatitis B, you should be screened for this virus. You are considered at high risk for hepatitis B if:  You were born in a country where hepatitis B is common. Ask your health care provider which countries are considered high risk.  Your parents were born in a high-risk country, and you have not been immunized against hepatitis B (hepatitis B vaccine).  You have HIV or AIDS.  You use needles to inject street drugs.  You live with someone who has hepatitis B.  You have had sex with someone who has hepatitis B.  You get hemodialysis treatment.  You take certain medicines for conditions, including cancer, organ transplantation, and autoimmune conditions. Hepatitis C  Blood testing is recommended for:  Everyone born from [birth year] through 1964.  Anyone with known  risk factors for hepatitis C. Sexually transmitted infections (STIs)  You should be screened for sexually transmitted infections (STIs) including gonorrhea and chlamydia if:  You are sexually active and are younger than 24 years of age.  You are older than 24 years of age and your health care provider tells you that you are at risk for this type of infection.  Your sexual activity has changed since you were last screened and you are at an increased risk for chlamydia or gonorrhea. Ask your health care provider if you  are at risk.  If you do not have HIV, but are at risk, it may be recommended that you take a prescription medicine daily to prevent HIV infection. This is called pre-exposure prophylaxis (PrEP). You are considered at risk if:  You are sexually active and do not regularly use condoms or know the HIV status of your partner(s).  You take drugs by injection.  You are sexually active with a partner who has HIV. Talk with your health care provider about whether you are at high risk of being infected with HIV. If you choose to begin PrEP, you should first be tested for HIV. You should then be tested every 3 months for as long as you are taking PrEP.  PREGNANCY   If you are premenopausal and you may become pregnant, ask your health care provider about preconception counseling.  If you may become pregnant, take 400 to 800 micrograms (mcg) of folic acid every day.  If you want to prevent pregnancy, talk to your health care provider about birth control (contraception). OSTEOPOROSIS AND MENOPAUSE   Osteoporosis is a disease in which the bones lose minerals and strength with aging. This can result in serious bone fractures. Your risk for osteoporosis can be identified using a bone density scan.  If you are 28 years of age or older, or if you are at risk for osteoporosis and fractures, ask your health care provider if you should be screened.  Ask your health care provider whether you  should take a calcium or vitamin D supplement to lower your risk for osteoporosis.  Menopause may have certain physical symptoms and risks.  Hormone replacement therapy may reduce some of these symptoms and risks. Talk to your health care provider about whether hormone replacement therapy is right for you.  HOME CARE INSTRUCTIONS   Schedule regular health, dental, and eye exams.  Stay current with your immunizations.   Do not use any tobacco products including cigarettes, chewing tobacco, or electronic cigarettes.  If you are pregnant, do not drink alcohol.  If you are breastfeeding, limit how much and how often you drink alcohol.  Limit alcohol intake to no more than 1 drink per day for nonpregnant women. One drink equals 12 ounces of beer, 5 ounces of wine, or 1 ounces of hard liquor.  Do not use street drugs.  Do not share needles.  Ask your health care provider for help if you need support or information about quitting drugs.  Tell your health care provider if you often feel depressed.  Tell your health care provider if you have ever been abused or do not feel safe at home.   This information is not intended to replace advice given to you by your health care provider. Make sure you discuss any questions you have with your health care provider.   Document Released: 12/30/2010 Document Revised: 07/07/2014 Document Reviewed: 05/18/2013 Elsevier Interactive Patient Education 2016 [employer].  Gastroesophageal Reflux Disease, Adult Normally, food travels down the esophagus and stays in the stomach to be digested. However, when a person has gastroesophageal reflux disease (GERD), food and stomach acid move back up into the esophagus. When this happens, the esophagus becomes sore and inflamed. Over time, GERD can create small holes (ulcers) in the lining of the esophagus.  CAUSES This condition is caused by a problem with the muscle between the esophagus and the stomach (lower  esophageal sphincter, or LES). Normally, the LES muscle closes after food passes through the esophagus to the  stomach. When the LES is weakened or abnormal, it does not close properly, and that allows food and stomach acid to go back up into the esophagus. The LES can be weakened by certain dietary substances, medicines, and medical conditions, including:  Tobacco use.  Pregnancy.  Having a hiatal hernia.  Heavy alcohol use.  Certain foods and beverages, such as coffee, chocolate, onions, and peppermint. RISK FACTORS This condition is more likely to develop in:  People who have an increased body weight.  People who have connective tissue disorders.  People who use NSAID medicines. SYMPTOMS Symptoms of this condition include:  Heartburn.  Difficult or painful swallowing.  The feeling of having a lump in the throat.  Abitter taste in the mouth.  Bad breath.  Having a large amount of saliva.  Having an upset or bloated stomach.  Belching.  Chest pain.  Shortness of breath or wheezing.  Ongoing (chronic) cough or a night-time cough.  Wearing away of tooth enamel.  Weight loss. Different conditions can cause chest pain. Make sure to see your health care provider if you experience chest pain. DIAGNOSIS Your health care provider will take a medical history and perform a physical exam. To determine if you have mild or severe GERD, your health care provider may also monitor how you respond to treatment. You may also have other tests, including:  An endoscopy toexamine your stomach and esophagus with a small camera.  A test thatmeasures the acidity level in your esophagus.  A test thatmeasures how much pressure is on your esophagus.  A barium swallow or modified barium swallow to show the shape, size, and functioning of your esophagus. TREATMENT The goal of treatment is to help relieve your symptoms and to prevent complications. Treatment for this condition may  vary depending on how severe your symptoms are. Your health care provider may recommend:  Changes to your diet.  Medicine.  Surgery. HOME CARE INSTRUCTIONS Diet  Follow a diet as recommended by your health care provider. This may involve avoiding foods and drinks such as:  Coffee and tea (with or without caffeine).  Drinks that containalcohol.  Energy drinks and sports drinks.  Carbonated drinks or sodas.  Chocolate and cocoa.  Peppermint and mint flavorings.  Garlic and onions.  Horseradish.  Spicy and acidic foods, including peppers, chili powder, curry powder, vinegar, hot sauces, and barbecue sauce.  Citrus fruit juices and citrus fruits, such as oranges, lemons, and limes.  Tomato-based foods, such as red sauce, chili, salsa, and pizza with red sauce.  Fried and fatty foods, such as donuts, french fries, potato chips, and high-fat dressings.  High-fat meats, such as hot dogs and fatty cuts of red and white meats, such as rib eye steak, sausage, ham, and bacon.  High-fat dairy items, such as whole milk, butter, and cream cheese.  Eat small, frequent meals instead of large meals.  Avoid drinking large amounts of liquid with your meals.  Avoid eating meals during the 2-3 hours before bedtime.  Avoid lying down right after you eat.  Do not exercise right after you eat. General Instructions  Pay attention to any changes in your symptoms.  Take over-the-counter and prescription medicines only as told by your health care provider. Do not take aspirin, ibuprofen, or other NSAIDs unless your health care provider told you to do so.  Do not use any tobacco products, including cigarettes, chewing tobacco, and e-cigarettes. If you need help quitting, ask your health care provider.  Wear loose-fitting  clothing. Do not wear anything tight around your waist that causes pressure on your abdomen.  Raise (elevate) the head of your bed 6 inches (15cm).  Try to reduce  your stress, such as with yoga or meditation. If you need help reducing stress, ask your health care provider.  If you are overweight, reduce your weight to an amount that is healthy for you. Ask your health care provider for guidance about a safe weight loss goal.  Keep all follow-up visits as told by your health care provider. This is important. SEEK MEDICAL CARE IF:  You have new symptoms.  You have unexplained weight loss.  You have difficulty swallowing, or it hurts to swallow.  You have wheezing or a persistent cough.  Your symptoms do not improve with treatment.  You have a hoarse voice. SEEK IMMEDIATE MEDICAL CARE IF:  You have pain in your arms, neck, jaw, teeth, or back.  You feel sweaty, dizzy, or light-headed.  You have chest pain or shortness of breath.  You vomit and your vomit looks like blood or coffee grounds.  You faint.  Your stool is bloody or black.  You cannot swallow, drink, or eat.   This information is not intended to replace advice given to you by your health care provider. Make sure you discuss any questions you have with your health care provider.   Document Released: 03/26/2005 Document Revised: 03/07/2015 Document Reviewed: 10/11/2014 Elsevier Interactive Patient Education [employer].

## 2015-08-27 NOTE — Progress Notes (Signed)
Pre visit review using our clinic review tool, if applicable. No additional management support is needed unless otherwise documented below in the visit note. 

## 2015-08-27 NOTE — Assessment & Plan Note (Signed)
Well-controlled with sertraline daily Has not needed clonazepam-taken off list Seeing a counselor regularly Continue current dose of sertraline

## 2015-08-27 NOTE — Assessment & Plan Note (Addendum)
Taking singulair daily, but still having some allergy symptoms Start a daily anti-histamine - claritin or zyrtec daily

## 2015-08-29 ENCOUNTER — Encounter: Payer: Self-pay | Admitting: Gastroenterology

## 2015-08-29 ENCOUNTER — Ambulatory Visit: Payer: BLUE CROSS/BLUE SHIELD | Admitting: Psychology

## 2015-08-30 ENCOUNTER — Encounter: Payer: Self-pay | Admitting: Emergency Medicine

## 2015-08-30 LAB — VITAMIN D 1,25 DIHYDROXY
VITAMIN D3 1, 25 (OH): 49 pg/mL
Vitamin D 1, 25 (OH)2 Total: 49 pg/mL (ref 18–72)

## 2015-09-17 ENCOUNTER — Ambulatory Visit (INDEPENDENT_AMBULATORY_CARE_PROVIDER_SITE_OTHER): Payer: BLUE CROSS/BLUE SHIELD | Admitting: Family Medicine

## 2015-09-17 ENCOUNTER — Encounter: Payer: Self-pay | Admitting: Family Medicine

## 2015-09-17 VITALS — BP 100/82 | HR 98 | Ht 63.0 in | Wt 130.0 lb

## 2015-09-17 DIAGNOSIS — M9901 Segmental and somatic dysfunction of cervical region: Secondary | ICD-10-CM

## 2015-09-17 DIAGNOSIS — M9902 Segmental and somatic dysfunction of thoracic region: Secondary | ICD-10-CM

## 2015-09-17 DIAGNOSIS — R293 Abnormal posture: Secondary | ICD-10-CM | POA: Diagnosis not present

## 2015-09-17 DIAGNOSIS — M999 Biomechanical lesion, unspecified: Secondary | ICD-10-CM | POA: Insufficient documentation

## 2015-09-17 DIAGNOSIS — M899 Disorder of bone, unspecified: Secondary | ICD-10-CM

## 2015-09-17 DIAGNOSIS — M9908 Segmental and somatic dysfunction of rib cage: Secondary | ICD-10-CM

## 2015-09-17 NOTE — Assessment & Plan Note (Signed)
Decision today to treat with OMT was based on Physical Exam  After verbal consent patient was treated with HVLA, ME, FPR techniques in cervical, thoracic, rib and lumbar areas  Patient tolerated the procedure well with improvement in symptoms  Patient given exercises, stretches and lifestyle modifications  See medications in patient instructions if given  Patient will follow up in 3-4 weeks      

## 2015-09-17 NOTE — Progress Notes (Signed)
Ellen ScaleZach Whitney D.O. [facility] [address] [place], [place] [address] Phone: [phone] Subjective:    I'm seeing this patient by the request  of:  Pincus SanesStacy J Burns, MD   CC: mid back pain  BJY:NWGNFAOZHYHPI:Subjective Ellen Whitney is a 25 y.o. female coming in with complaint of mid back pain. Patient states that she has had this pain for years. Seems to be worse when she's been standing long amount of time. Patient describes it as a dull, throbbing aching pain. No radiation to the arms. Seems that if she does more activity it seems to get somewhat better. Patient states when she is less mobile it seems to maybe get worse. Does not remember any true injury. Rates the severity pain is 6 out of 10. Seems to be happening more frequently recently. Has tried ibuprofen and heat which are helpful.     Past Medical History  Diagnosis Date  . Allergy   . Eczema   . Anxiety   . Depression    Past Surgical History  Procedure Laterality Date  . No past surgeries     Social History   Social History  . Marital Status: Single    Spouse Name: N/A  . Number of Children: 0  . Years of Education: 16   Occupational History  . 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 Smoking status: Never Smoker   . Smokeless tobacco: Never Used  . Alcohol Use: No  . Drug Use: No  . Sexual Activity: Yes    Birth Control/ Protection: None   Other Topics Concern  . None   Social History Narrative   Fun: Workout, hangout with friends   Denies religious beliefs effecting health care.   Denies abuse and feels safe where she lives.    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 Colon cancer Maternal Grandmother   . Healthy Mother   . Healthy Father   . Thyroid disease Maternal Aunt   . Cancer Paternal Uncle     two great uncles with stomach or colon cancer     Past medical history, social, surgical and family history all reviewed in electronic medical record.  No pertanent information unless stated regarding to the chief complaint.   Review of Systems: No headache, visual changes, nausea, vomiting, diarrhea, constipation, dizziness, abdominal pain, skin rash, fevers, chills, night sweats, weight loss, swollen lymph nodes, body aches, joint swelling, muscle aches, chest pain, shortness of breath, mood changes.   Objective Blood pressure 100/82, pulse 98, height 5\' 3"  (1.6 m), weight 130 lb (58.968 kg), SpO2 97 %.  General: No apparent distress alert and oriented x3 mood and affect normal, dressed appropriately.  HEENT: Pupils equal, extraocular movements intact  Respiratory: Patient's speak in full sentences and does not appear short of breath  Cardiovascular: No lower extremity edema, non tender, no erythema  Skin: Warm dry intact with no signs of infection or rash on extremities or on axial skeleton.  Abdomen: Soft nontender  Neuro: Cranial nerves II through XII are intact, neurovascularly intact in all extremities with 2+ DTRs and 2+ pulses.  Lymph: No lymphadenopathy of posterior or anterior cervical chain or axillae bilaterally.  Gait normal with good balance and coordination.  MSK:  Non tender with full range of motion and  good stability and symmetric strength and tone of shoulders, elbows, wrist, hip, knee and ankles bilaterally.  Back Exam:  Inspection: Unremarkable except for some mild increasing kyphosis. Motion: Flexion 45 deg, Extension 45 deg, Side Bending to 45 deg bilaterally,  Rotation to 45 deg bilaterally  SLR laying: Negative  XSLR laying: Negative  Palpable tenderness: tender in the paraspinal musculature around the scapula FABER: negative. Sensory change: Gross sensation intact to all lumbar and sacral dermatomes.  Reflexes: 2+ at both patellar tendons, 2+ at achilles tendons, Babinski's downgoing.  Strength at foot   Plantar-flexion: 5/5 Dorsi-flexion: 5/5 Eversion: 5/5 Inversion: 5/5  Leg strength  Quad: 5/5 Hamstring: 5/5 Hip flexor: 5/5 Hip abductors: 5/5  Gait unremarkable. Mild delay in the scapula inferiorly on the right side with abduction  Osteopathic findings C2 flexed rotated and side bent left T1 extended rotated and side bent left with elevated first rib T5 extended rotated inside that right L2 flexed rotated and side bent left Sacrum right on right  Procedure note 97110; 15 minutes spent for Therapeutic exercises as stated in above notes.  This included exercises focusing on stretching, strengthening, with significant focus on eccentric aspects. Basic scapular stabilization to include adduction and depression of scapula Scaption, focusing on proper movement and good control Internal and External rotation utilizing a theraband, with elbow tucked at side entire time Rows with theraband  Proper technique shown and discussed handout in great detail with ATC.  All questions were discussed and answered.     Impression and Recommendations:     This case required medical decision making of moderate complexity.

## 2015-09-17 NOTE — Patient Instructions (Signed)
Good to see you  Ice 20 minutes 2 times daily. Usually after activity and before bed. Exercises 3 times a week.  On wall with heels, butt shoulder and head touching for a goal of 5 minutes daily  Work on posture through the day Better shoes can help alignment as well For the knees consider biking for cardio and wall sits can help Vitamin D 2000 IU daily  Turmeric 500mg  twice daily  See me again in 3 weeks

## 2015-09-17 NOTE — Progress Notes (Signed)
Pre visit review using our clinic review tool, if applicable. No additional management support is needed unless otherwise documented below in the visit note. 

## 2015-09-17 NOTE — Assessment & Plan Note (Signed)
Patient did have some dysfunction noted. We discussed icing regimen, we discussed strengthening. We discussed which activities to do in which ones to avoid. We discussed ergonomics throughout the day they can make it better. Patient and will come back and see me again in 3-4 weeks. Did respond very well to osteopathic manipulation.

## 2015-10-08 ENCOUNTER — Ambulatory Visit (INDEPENDENT_AMBULATORY_CARE_PROVIDER_SITE_OTHER): Payer: BLUE CROSS/BLUE SHIELD | Admitting: Family Medicine

## 2015-10-08 ENCOUNTER — Encounter: Payer: Self-pay | Admitting: Family Medicine

## 2015-10-08 VITALS — BP 104/72 | HR 93 | Ht 63.0 in | Wt 130.0 lb

## 2015-10-08 DIAGNOSIS — M899 Disorder of bone, unspecified: Secondary | ICD-10-CM | POA: Diagnosis not present

## 2015-10-08 DIAGNOSIS — M999 Biomechanical lesion, unspecified: Secondary | ICD-10-CM

## 2015-10-08 DIAGNOSIS — M9902 Segmental and somatic dysfunction of thoracic region: Secondary | ICD-10-CM

## 2015-10-08 DIAGNOSIS — M9908 Segmental and somatic dysfunction of rib cage: Secondary | ICD-10-CM | POA: Diagnosis not present

## 2015-10-08 DIAGNOSIS — M9901 Segmental and somatic dysfunction of cervical region: Secondary | ICD-10-CM

## 2015-10-08 NOTE — Progress Notes (Signed)
Pre visit review using our clinic review tool, if applicable. No additional management support is needed unless otherwise documented below in the visit note. 

## 2015-10-08 NOTE — Assessment & Plan Note (Signed)
Patient still has some more activity. We discussed different ergonomics in different changes she can make throughout the day. Encourage her to go to the gym on a more regular basis. Given another handout today. We discussed the over-the-counter medications again. Patient will follow-up with me again in 4-6 weeks for further evaluation and treatment.

## 2015-10-08 NOTE — Assessment & Plan Note (Signed)
Decision today to treat with OMT was based on Physical Exam  After verbal consent patient was treated with HVLA, ME, FPR techniques in cervical, thoracic, rib and lumbar areas  Patient tolerated the procedure well with improvement in symptoms  Patient given exercises, stretches and lifestyle modifications  See medications in patient instructions if given  Patient will follow up in 4-6 weeks

## 2015-10-08 NOTE — Progress Notes (Signed)
[name] D.O. [facility] [address] [place], [place] [address] Phone: [phone] Subjective:    I'm seeing this patient by the request  of:  Pincus SanesStacy J Burns, MD   CC: mid back pain f/u   QIH:[id number]:Subjective Lennox Laityour Mankins is a 25 y.o. female coming in with complaint of mid back pain. Patient was found to have some scapular dysfunction as well as poor posture. Patient elected try osteopathic manipulation. Given home exercises and we discussed ergonomics. Patient states she has not been doing the exercises regularly but is feeling good. Only started having more difficulty when she was working longer hours. Some mild discomfort but not as severe as what it was previously.     Past Medical History  Diagnosis Date  . Allergy   . Eczema   . Anxiety   . Depression    Past Surgical History  Procedure Laterality Date  . No past surgeries     Social History   Social History  . Marital Status: Single    Spouse Name: N/A  . Number of Children: 0  . Years of Education: 16   Occupational History  . 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 Smoking status: Never Smoker   . Smokeless tobacco: Never Used  . Alcohol Use: No  . Drug Use: No  . Sexual Activity: Yes    Birth Control/ Protection: None   Other Topics Concern  . None   Social History Narrative   Fun: Workout, hangout with friends   Denies religious beliefs effecting health care.   Denies abuse and feels safe where she lives.    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 Colon cancer Maternal Grandmother   . Healthy Mother   . Healthy Father   . Thyroid disease Maternal Aunt   . Cancer Paternal Uncle     two great uncles with stomach or colon cancer    Past medical history, social, surgical and family history all reviewed in electronic medical record.  No  pertanent information unless stated regarding to the chief complaint.   Review of Systems: No headache, visual changes, nausea, vomiting, diarrhea, constipation, dizziness, abdominal pain, skin rash, fevers, chills, night sweats, weight loss, swollen lymph nodes, body aches, joint swelling, muscle aches, chest pain, shortness of breath, mood changes.   Objective Blood pressure 104/72, pulse 93, height 5\' 3"  (1.6 m), weight 130 lb (58.968 kg), SpO2 98 %.  General: No apparent distress alert and oriented x3 mood and affect normal, dressed appropriately.  HEENT: Pupils equal, extraocular movements intact  Respiratory: Patient's speak in full sentences and does not appear short of breath  Cardiovascular: No lower extremity edema, non tender, no erythema  Skin: Warm dry intact with no signs of infection or rash on extremities or on axial skeleton.  Abdomen: Soft nontender  Neuro: Cranial nerves II through XII are intact, neurovascularly intact in all extremities with 2+ DTRs and 2+ pulses.  Lymph: No lymphadenopathy of posterior or anterior cervical chain or axillae bilaterally.  Gait normal with good balance and coordination.  MSK:  Non tender with full range of motion and good stability and symmetric strength and tone of shoulders, elbows, wrist, hip, knee and ankles bilaterally.  Back Exam:  Inspection: Unremarkable except for some mild increasing kyphosis.  Motion: Flexion 45 deg, Extension 35 deg, Side Bending to 45 deg bilaterally,  Rotation to 45 deg bilaterally  SLR laying: Negative  XSLR laying: Negative  Palpable tenderness: less tender in the scapular region previous exam FABER: negative. Sensory change: Gross sensation intact to all lumbar and sacral dermatomes.  Reflexes: 2+ at both patellar tendons, 2+ at achilles tendons, Babinski's downgoing.  Strength at foot  Plantar-flexion: 5/5 Dorsi-flexion: 5/5 Eversion: 5/5 Inversion: 5/5  Leg strength  Quad: 5/5 Hamstring: 5/5 Hip  flexor: 5/5 Hip abductors: 5/5  Gait unremarkable. Continue mild delay on the inferior aspect of the scapula.  Osteopathic findings C2 flexed rotated and side bent left T1 extended rotated and side bent left with elevated first rib T5 extended rotated inside that right T9 extended rotated and side bent right L2 flexed rotated and side bent left Sacrum right on right      Impression and Recommendations:     This case required medical decision making of moderate complexity.

## 2015-10-08 NOTE — Patient Instructions (Signed)
Good to see you  Ice when you need it Keep working on the posture.   Possibly a tennis ball between shoulder blades with driving.  Stay active See me again in 5-6 weeks!  I am impressed.

## 2015-10-11 ENCOUNTER — Ambulatory Visit (INDEPENDENT_AMBULATORY_CARE_PROVIDER_SITE_OTHER): Payer: BLUE CROSS/BLUE SHIELD | Admitting: Gastroenterology

## 2015-10-11 ENCOUNTER — Encounter: Payer: Self-pay | Admitting: Gastroenterology

## 2015-10-11 VITALS — BP 96/66 | HR 80 | Ht 63.0 in | Wt 130.2 lb

## 2015-10-11 DIAGNOSIS — R110 Nausea: Secondary | ICD-10-CM

## 2015-10-11 DIAGNOSIS — R1314 Dysphagia, pharyngoesophageal phase: Secondary | ICD-10-CM

## 2015-10-11 NOTE — Patient Instructions (Addendum)
A tablespoon of gaviscon liquid (over the counter) every morning when you wake up.  One prilosec tablet 20 mg (also over the counter) at supper time every day.  Please call us in 2-3 weeks with an update on how you are feeling.  If not better, we will proceed with an upper endoscopy.  Thank you for choosing [facility]  Dr Amada JupiterHenry Danis III

## 2015-10-11 NOTE — Progress Notes (Signed)
[facility] Consult Note:  HistoryLennox Whitney: Kaylanie Nevils 10/11/2015  Referring physician: Pincus SanesStacy J Burns, MD  Reason for consult/chief complaint: Nausea; Heartburn; and Abdominal Pain   Subjective HPI:  This is the initial office consult for a young lady referred to me for some upper digestive symptoms. For several months she has had the following constellation of symptoms: A somewhat queasy or nauseated feeling when she first wakes in the morning which keeps her from eating breakfast, feelings of her first few bites of food feeling briefly stuck in the esophagus, but then passing. Everything L Lindie SpruceSheetz in that same meal goes down without any trouble., Constipation that she attributed to her Zoloft, and has now resolved since stopping it. Her appetite is been good and her weight stable. She denies rectal bleeding. He does not get pyrosis or regurgitation, as taken no medicines for these symptoms.   ROS:  Review of Systems  Constitutional: Negative for appetite change and unexpected weight change.  HENT: Negative for mouth sores and voice change.   Eyes: Negative for pain and redness.  Respiratory: Negative for cough and shortness of breath.   Cardiovascular: Negative for chest pain and palpitations.  Genitourinary: Negative for dysuria and hematuria.  Musculoskeletal: Negative for myalgias and arthralgias.  Skin: Negative for pallor and rash.  Neurological: Negative for weakness and headaches.  Hematological: Negative for adenopathy.  Psychiatric/Behavioral: Positive for dysphoric mood.   She feels that her affective symptoms have been under good control lately.  Past Medical History: Past Medical History  Diagnosis Date  . Allergy   . Eczema   . Anxiety   . Depression      Past Surgical History: Past Surgical History  Procedure Laterality Date  . No past surgeries       Family History: Family History  Problem Relation Age of Onset  . Colon cancer Maternal  Grandmother   . Healthy Mother   . Healthy Father   . Thyroid disease Maternal Aunt   . Cancer Paternal Uncle     two great uncles with stomach or colon cancer  . Breast cancer Cousin   . Diabetes Other     runs in family, 2 sisters pre-diabetic  sister severe GERD  Social History: Social History   Social History  . Marital Status: Single    Spouse Name: N/A  . Number of Children: 0  . Years of Education: 16   Occupational History  . 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 Smoking status: Never Smoker   . Smokeless tobacco: Never Used  . Alcohol Use: No  . Drug Use: No  . Sexual Activity: Yes    Birth Control/ Protection: None   Other Topics Concern  . None   Social History Narrative   Fun: Workout, hangout with friends   Denies religious beliefs effecting health care.   Denies abuse and feels safe where she lives.    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 aluminum chloride (DRYSOL) 20 % external solution Apply 1-2x/wk at bedtime prn 35 mL 5  . Fluocinolone Acetonide 0.01 % OIL   0  . fluticasone (FLONASE) 50 MCG/ACT nasal spray   0  . montelukast (SINGULAIR) 10 MG tablet Take 1 tablet (10 mg total) by mouth at bedtime.  30 tablet 3  . sertraline (ZOLOFT) 50 MG tablet Take 1 tablet (50 mg total) by mouth daily. 30 tablet 1   No current facility-administered medications for this visit.      ___________________________________________________________________ Objective  Exam:  BP 96/66 mmHg  Pulse 80  Ht  (1.6 m)  Wt 130 lb 4 oz (59.081 kg)  BMI 23.08 kg/m2  LMP 10/11/2015   General: this is a(n) Well-appearing young woman with good muscle mass   Eyes: sclera anicteric, no redness  ENT: oral mucosa moist without lesions, no cervical or  supraclavicular lymphadenopathy, good dentition  CV: RRR without murmur, S1/S2, no JVD, no peripheral edema  Resp: clear to auscultation bilaterally, normal RR and effort noted  GI: soft, no tenderness, with active bowel sounds. No guarding or palpable organomegaly noted.  Skin; warm and dry, no rash or jaundice noted  Neuro: awake, alert and oriented x 3. Normal gross motor function and fluent speech  No lab or radiologic testing has been done  Assessment: Encounter Diagnoses  Name Primary?  Marland Kitchen Dysphagia, pharyngoesophageal phase Yes  . Nausea without vomiting     Symptoms are somewhat vague, does not sound typical for reflux or a mechanical cause of dysphagia. While she does have allergic symptoms, dysphagia is not typical of eosinophilic esophagitis.  Plan:  Trial of once daily PPI at supper time and some morning Gaviscon for bloating. She will call us in 2 or 3 weeks with an update. If she has not improved, pursue upper endoscopy.  Thank you for the courtesy of this consult.  Please call me with any questions or concerns.  Charlie Pitter III

## 2015-11-12 ENCOUNTER — Encounter: Payer: Self-pay | Admitting: Family Medicine

## 2015-11-12 ENCOUNTER — Ambulatory Visit (INDEPENDENT_AMBULATORY_CARE_PROVIDER_SITE_OTHER): Payer: BLUE CROSS/BLUE SHIELD | Admitting: Family Medicine

## 2015-11-12 VITALS — BP 120/78 | HR 91 | Ht 63.0 in | Wt 131.0 lb

## 2015-11-12 DIAGNOSIS — M899 Disorder of bone, unspecified: Secondary | ICD-10-CM | POA: Diagnosis not present

## 2015-11-12 DIAGNOSIS — M9901 Segmental and somatic dysfunction of cervical region: Secondary | ICD-10-CM | POA: Diagnosis not present

## 2015-11-12 DIAGNOSIS — M9908 Segmental and somatic dysfunction of rib cage: Secondary | ICD-10-CM | POA: Diagnosis not present

## 2015-11-12 DIAGNOSIS — M9902 Segmental and somatic dysfunction of thoracic region: Secondary | ICD-10-CM

## 2015-11-12 DIAGNOSIS — M999 Biomechanical lesion, unspecified: Secondary | ICD-10-CM

## 2015-11-12 NOTE — Progress Notes (Signed)
[facility] [address] [place], [place] [address] Phone: [phone] Subjective:    I'm seeing this patient by the request  of:  Pincus Sanes, MD   CC: mid back pain f/u   [name] is a 25 y.o. female coming in with complaint of mid back pain. Patient was found to have some scapular dysfunction as well as poor posture. Patient elected try osteopathic manipulation. Patient recently did graduate from college. Since then she has not been doing exercises as regularly. Patient is trying to increase her activity and started to go to the gym on a more regular basis. Has not done that she had. Rates the severity of discomfort as 4 out of 10. Overall does think that she is improving slowly.     Past Medical History  Diagnosis Date  . Allergy   . Eczema   . Anxiety   . Depression    Past Surgical History  Procedure Laterality Date  . No past surgeries     Social History   Social History  . Marital Status: Single    Spouse Name: N/A  . Number of Children: 0  . Years of Education: 16   Occupational History  . 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 Smoking status: Never Smoker   . Smokeless tobacco: Never Used  . Alcohol Use: No  . Drug Use: No  . Sexual Activity: Yes    Birth Control/ Protection: None   Other Topics Concern  . None   Social History Narrative   Fun: Workout, hangout with friends   Denies religious beliefs effecting health care.   Denies abuse and feels safe where she lives.    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 Colon cancer Maternal Grandmother   . Healthy Mother   . Healthy Father   . Thyroid disease Maternal Aunt   . Cancer Paternal Uncle     two great uncles with stomach or colon cancer  . Breast cancer Cousin   . Diabetes Other     runs in  family, 2 sisters pre-diabetic    Past medical history, social, surgical and family history all reviewed in electronic medical record.  No pertanent information unless stated regarding to the chief complaint.   Review of Systems: No headache, visual changes, nausea, vomiting, diarrhea, constipation, dizziness, abdominal pain, skin rash, fevers, chills, night sweats, weight loss, swollen lymph nodes, body aches, joint swelling, muscle aches, chest pain, shortness of breath, mood changes.   Objective Blood pressure 120/78, pulse 91, height  (1.6 m), weight 131 lb (59.421 kg), SpO2 97 %.  General: No apparent distress alert and oriented x3 mood and affect normal, dressed appropriately.  HEENT: Pupils equal, extraocular movements intact  Respiratory: Patient's speak in full sentences and does not appear short of breath  Cardiovascular: No lower extremity edema, non tender, no erythema  Skin: Warm dry intact with no signs of infection or rash on extremities or on axial skeleton.  Abdomen: Soft nontender  Neuro: Cranial nerves II through XII are intact, neurovascularly intact in all extremities with 2+ DTRs and 2+ pulses.  Lymph: No lymphadenopathy of posterior or anterior cervical chain or axillae bilaterally.  Gait normal with good balance and coordination.  MSK:  Non  tender with full range of motion and good stability and symmetric strength and tone of shoulders, elbows, wrist, hip, knee and ankles bilaterally.  Back Exam:  Inspection: Unremarkable except for some mild increasing kyphosis. Motion: Flexion 45 deg, Extension 35 deg, Side Bending to 45 deg bilaterally,  Rotation to 45 deg bilaterally  SLR laying: Negative  XSLR laying: Negative  Palpable tenderness: less tender in the scapular region previous exam FABER: negative. Sensory change: Gross sensation intact to all lumbar and sacral dermatomes.  Reflexes: 2+ at both patellar tendons, 2+ at achilles tendons, Babinski's downgoing.   Strength at foot  Plantar-flexion: 5/5 Dorsi-flexion: 5/5 Eversion: 5/5 Inversion: 5/5  Leg strength  Quad: 5/5 Hamstring: 5/5 Hip flexor: 5/5 Hip abductors: 5/5  Gait unremarkable. Continue mild delay on the inferior aspect of the scapula.  Osteopathic findings C2 flexed rotated and side bent left T1 extended rotated and side bent left with elevated first rib T5 extended rotated inside that right T9 extended rotated and side bent right L2 flexed rotated and side bent left Sacrum right on right      Impression and Recommendations:     This case required medical decision making of moderate complexity.

## 2015-11-12 NOTE — Assessment & Plan Note (Signed)
Still believe that some of this is still from muscle imbalances. We discussed icing regimen and home exercises. We discussed which activities to do in which ones to avoid. We discussed working on postural changing exercises. Patient will continue see me every 4-8 weeks for further evaluation and treatment.

## 2015-11-12 NOTE — Patient Instructions (Signed)
Great to see you  Ice when you need it Congrats again One knee down one up, tilt pelvis forward.  Hold 10 seconds.  Can rotate upper torso toward knee that is up to make it harder Repeat on each side See me again in 4-8 weeks.

## 2015-11-12 NOTE — Progress Notes (Signed)
Pre visit review using our clinic review tool, if applicable. No additional management support is needed unless otherwise documented below in the visit note. 

## 2015-11-12 NOTE — Assessment & Plan Note (Signed)
Decision today to treat with OMT was based on Physical Exam  After verbal consent patient was treated with HVLA, ME, FPR techniques in cervical, thoracic, rib and lumbar areas  Patient tolerated the procedure well with improvement in symptoms  Patient given exercises, stretches and lifestyle modifications  See medications in patient instructions if given  Patient will follow up in 4-8 weeks

## 2015-12-28 ENCOUNTER — Ambulatory Visit: Payer: BLUE CROSS/BLUE SHIELD | Admitting: Family Medicine

## 2016-03-05 ENCOUNTER — Other Ambulatory Visit: Payer: BLUE CROSS/BLUE SHIELD

## 2016-03-05 ENCOUNTER — Encounter: Payer: Self-pay | Admitting: Nurse Practitioner

## 2016-03-05 ENCOUNTER — Ambulatory Visit (INDEPENDENT_AMBULATORY_CARE_PROVIDER_SITE_OTHER): Payer: BLUE CROSS/BLUE SHIELD | Admitting: Nurse Practitioner

## 2016-03-05 VITALS — BP 114/62 | HR 86 | Temp 98.5°F | Ht 63.0 in | Wt 128.0 lb

## 2016-03-05 DIAGNOSIS — R1030 Lower abdominal pain, unspecified: Secondary | ICD-10-CM | POA: Diagnosis not present

## 2016-03-05 DIAGNOSIS — R112 Nausea with vomiting, unspecified: Secondary | ICD-10-CM

## 2016-03-05 DIAGNOSIS — Z7251 High risk heterosexual behavior: Secondary | ICD-10-CM | POA: Diagnosis not present

## 2016-03-05 LAB — POCT URINALYSIS DIPSTICK
BILIRUBIN UA: NEGATIVE
Glucose, UA: NEGATIVE
KETONES UA: NEGATIVE
Leukocytes, UA: NEGATIVE
Nitrite, UA: NEGATIVE
PH UA: 6.5
Protein, UA: NEGATIVE
RBC UA: NEGATIVE
SPEC GRAV UA: 1.015
Urobilinogen, UA: 0.2

## 2016-03-05 NOTE — Progress Notes (Signed)
Pre visit review using our clinic review tool, if applicable. No additional management support is needed unless otherwise documented below in the visit note. 

## 2016-03-05 NOTE — Patient Instructions (Addendum)
Patient advised about safe sex practices and/or abstinence. Will call with lab result.

## 2016-03-05 NOTE — Progress Notes (Signed)
Subjective:  Patient ID: Ellen Whitney, female    DOB: [birth year]  Age: 25 y.o. MRN: [id number]  CC: Nausea (took Plan B 3-4 weeks ago and urine pregnancy test (negative), but wants blood test)   Ellen Whitney presents for serum pregnancy test.  HPI She had unprotected sex 90month ago, used plan B method 3days after intercourse. Has been having nausea and suprapubic ABD pain for last 3weeks. Symptoms are intermittent and mild. No fever, no vomiting, no diarrhea or constipation. No GERD symptoms. No URI symptoms. No vaginal symptoms. Urine pregnancy at home was negative. Next menstrual cycle is expected in 10days.  Outpatient Medications Prior to Visit  Medication Sig Dispense Refill  . aluminum chloride (DRYSOL) 20 % external solution Apply 1-2x/wk at bedtime prn 35 mL 5  . Fluocinolone Acetonide 0.01 % OIL   0  . fluticasone (FLONASE) 50 MCG/ACT nasal spray   0  . montelukast (SINGULAIR) 10 MG tablet Take 1 tablet (10 mg total) by mouth at bedtime. 30 tablet 3  . sertraline (ZOLOFT) 50 MG tablet Take 1 tablet (50 mg total) by mouth daily. 30 tablet 1   No facility-administered medications prior to visit.     ROS See HPI  Objective:  BP 114/62 (BP Location: Left Arm, Patient Position: Sitting, Cuff Size: Normal)   Pulse 86   Temp 98.5 F (36.9 C) (Oral)   Ht 5\' 3"  (1.6 m)   Wt 128 lb (58.1 kg)   SpO2 97%   BMI 22.67 kg/m   BP Readings from Last 3 Encounters:  03/05/16 114/62  11/12/15 120/78  10/11/15 96/66    Wt Readings from Last 3 Encounters:  03/05/16 128 lb (58.1 kg)  11/12/15 131 lb (59.4 kg)  10/11/15 130 lb 4 oz (59.1 kg)    Physical Exam  Constitutional: She is oriented to person, place, and time. She appears well-developed and well-nourished. No distress.  HENT:  Nose: Nose normal.  Mouth/Throat: Oropharynx is clear and moist. No oropharyngeal exudate.  Cardiovascular: Normal rate and normal heart sounds.   Pulmonary/Chest: Effort normal.    Abdominal: Soft. Bowel sounds are normal. She exhibits no distension. There is tenderness.  Suprapubic tenderness. No CVA tenderness.  Neurological: She is alert and oriented to person, place, and time.  Skin: Skin is warm and dry.  Psychiatric: She has a normal mood and affect. Her behavior is normal.  Vitals reviewed.   Lab Results  Component Value Date   WBC 7.3 08/27/2015   HGB 14.3 08/27/2015   HCT 41.3 08/27/2015   PLT 257.0 08/27/2015   GLUCOSE 96 08/27/2015   CHOL 196 08/27/2015   TRIG 123.0 08/27/2015   HDL 50.40 08/27/2015   LDLCALC 121 (H) 08/27/2015   ALT 14 08/27/2015   AST 15 08/27/2015   NA 136 08/27/2015   K 3.9 08/27/2015   CL 102 08/27/2015   CREATININE 0.66 08/27/2015   BUN 13 08/27/2015   CO2 27 08/27/2015   TSH 2.72 08/27/2015    Recent Results (from the past 2160 hour(s))  hCG, serum, qualitative     Status: None   Collection Time: 03/05/16  9:22 AM  Result Value Ref Range   Preg, Serum NEGATIVE     Comment:   Reference Range Non-pregnant: Negative Pregnant:     Positive   POCT Urinalysis Dipstick     Status: None   Collection Time: 03/05/16  9:51 AM  Result Value Ref Range   Color, UA yellow  Clarity, UA clear    Glucose, UA neg    Bilirubin, UA neg    Ketones, UA neg    Spec Grav, UA 1.015    Blood, UA neg    pH, UA 6.5    Protein, UA neg    Urobilinogen, UA 0.2    Nitrite, UA neg    Leukocytes, UA Negative Negative    Assessment & Plan:   Ellen Whitney was seen today for nausea.  Diagnoses and all orders for this visit:  Suprapubic abdominal pain, unspecified laterality -     POCT Urinalysis Dipstick -     Cancel: hCG, serum, qualitative; Future -     hCG, quantitative, pregnancy; Future  Nausea and vomiting, intractability of vomiting not specified, unspecified vomiting type -     POCT Urinalysis Dipstick -     Cancel: hCG, serum, qualitative; Future -     hCG, quantitative, pregnancy; Future  Unprotected sexual  intercourse -     POCT Urinalysis Dipstick -     Cancel: hCG, serum, qualitative; Future -     hCG, quantitative, pregnancy; Future   I am having Ellen Whitney maintain her fluticasone, Fluocinolone Acetonide, montelukast, aluminum chloride, and sertraline.  No orders of the defined types were placed in this encounter.   Follow-up: Return if symptoms worsen or fail to improve.  Ellen Pennaharlotte Nche, NP

## 2016-03-06 LAB — HCG, SERUM, QUALITATIVE: Preg, Serum: NEGATIVE

## 2016-04-01 ENCOUNTER — Encounter: Payer: Self-pay | Admitting: Family Medicine

## 2016-04-01 ENCOUNTER — Ambulatory Visit (INDEPENDENT_AMBULATORY_CARE_PROVIDER_SITE_OTHER): Payer: BLUE CROSS/BLUE SHIELD | Admitting: Family Medicine

## 2016-04-01 DIAGNOSIS — M999 Biomechanical lesion, unspecified: Secondary | ICD-10-CM | POA: Insufficient documentation

## 2016-04-01 DIAGNOSIS — M76891 Other specified enthesopathies of right lower limb, excluding foot: Secondary | ICD-10-CM

## 2016-04-01 NOTE — Progress Notes (Signed)
Tawana ScaleZach Council Munguia D.O. [facility] [address] [place], [place] [address] Phone: [phone] Subjective:    I'm seeing this patient by the request  of:  Pincus SanesStacy J Burns, MD   CC: mid back pain f/u  Problem leg pain  MVH:[id number]:Subjective  Ellen Whitney is a 25 y.o. female coming in with complaint of mid back pain. Seems like the back pain seems to be doing relatively well. Some mild increase in pain but nothing severe as what it was initially. Has not been on any exercises as regularly.  Patient has been noticing more for right leg pain. Seems to be on the anterior aspect the leg. Not as bad with activity but states that it is more of a cramping sensation after activity as well as at night. Patient states that she has been working on a more regular basis. Does not remember any true injury. Rates the severity pain is 4 out of 10 that seems to be increasing frequency. Denies any radiation down the leg or any numbness or tingling.     Past Medical History:  Diagnosis Date  . Allergy   . Anxiety   . Depression   . Eczema    Past Surgical History:  Procedure Laterality Date  . NO PAST SURGERIES     Social History   Social History  . Marital status: Single    Spouse name: N/A  . Number of children: 0  . Years of education: 1416   Occupational History  . 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 Smoking status: Never Smoker  . Smokeless tobacco: Never Used  . Alcohol use No  . Drug use: No  . Sexual activity: Yes    Birth control/ protection: None   Other Topics Concern  . Not on file   Social History Narrative   Fun: Workout, hangout with friends   Denies religious beliefs effecting health care.   Denies abuse and feels safe where she lives.    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 Colon cancer Maternal Grandmother   .  Healthy Mother   . Healthy Father   . Thyroid disease Maternal Aunt   . Cancer Paternal Uncle     two great uncles with stomach or colon cancer  . Breast cancer Cousin   . Diabetes Other     runs in family, 2 sisters pre-diabetic    Past medical history, social, surgical and family history all reviewed in electronic medical record.  No pertanent information unless stated regarding to the chief complaint.   Review of Systems: No headache, visual changes, nausea, vomiting, diarrhea, constipation, dizziness, abdominal pain, skin rash, fevers, chills, night sweats, weight loss, swollen lymph nodes, body aches, joint swelling, muscle aches, chest pain, shortness of breath, mood changes.   Objective  There were no vitals taken for this visit.  General: No apparent distress alert and oriented x3 mood and affect normal, dressed appropriately.  HEENT: Pupils equal, extraocular movements intact  Respiratory: Patient's speak in full sentences and does not appear short of breath  Cardiovascular: No lower extremity edema, non tender, no erythema  Skin: Warm dry intact with no signs of infection or rash on extremities or on axial skeleton.  Abdomen: Soft nontender  Neuro: Cranial nerves II through XII are intact,  neurovascularly intact in all extremities with 2+ DTRs and 2+ pulses.  Lymph: No lymphadenopathy of posterior or anterior cervical chain or axillae bilaterally.  Gait normal with good balance and coordination.  MSK:  Non tender with full range of motion and good stability and symmetric strength and tone of shoulders, elbows, wrist, , knee and ankles bilaterally.  Back Exam:  Inspection: Unremarkable except for some mild increasing kyphosis. Motion: Flexion 45 deg, Extension 15 deg, Side Bending to 35 deg bilaterally,  Rotation to 35 deg bilaterally more tightness than previous exam SLR laying: Negative  XSLR laying: Negative  Palpable tenderness:Tenderness to palpation at the insertion of  the hip flexor on the lesser trochanteric area FABER: Tightness on the right side compared to the contralateral side Sensory change: Gross sensation intact to all lumbar and sacral dermatomes.  Reflexes: 2+ at both patellar tendons, 2+ at achilles tendons, Babinski's downgoing.  Strength at foot  Plantar-flexion: 5/5 Dorsi-flexion: 5/5 Eversion: 5/5 Inversion: 5/5  Leg strength  Quad: 5/5 Hamstring: 5/5 Hip flexor: 5/5 Hip abductors: 4/5  Gait unremarkable. Continue mild delay on the inferior aspect of the scapula.  Osteopathic findings C2 flexed rotated and side bent left T3 extended rotated inside that right T7 extended rotated and side bent right L2 flexed rotated and side bent left Sacrum right on right      Impression and Recommendations:     This case required medical decision making of moderate complexity.

## 2016-04-01 NOTE — Assessment & Plan Note (Signed)
She does have more of a hip flexor tendinitis. I do think that there is some malalignment of patient's pelvic area that likely contributed to some of the discomfort. We discussed with patient at great length. We discussed home exercises, patient work with Event organiserathletic trainer, we discussed over-the-counter medications. Follow-up again in 4-6 weeks for further evaluation and manipulation.

## 2016-04-01 NOTE — Patient Instructions (Signed)
Goo to see you  Vitamin D 2000 IU daily  Iron 65mg  daily with 500mg  of vitamin C, if constipates then go to 3 times a week.  Keep working out, you are doing great but do our exercises 3 times a week.  Your body likes manipulation  See me again in 4-6 weeks.

## 2016-04-01 NOTE — Assessment & Plan Note (Signed)
Decision today to treat with OMT was based on Physical Exam  After verbal consent patient was treated with HVLA, ME, FPR techniques in cervical, thoracic, lumbar and sacral areas  Patient tolerated the procedure well with improvement in symptoms  Patient given exercises, stretches and lifestyle modifications  See medications in patient instructions if given  Patient will follow up in 4-6 weeks 

## 2016-05-05 ENCOUNTER — Other Ambulatory Visit: Payer: Self-pay | Admitting: Nurse Practitioner

## 2016-05-05 DIAGNOSIS — N6310 Unspecified lump in the right breast, unspecified quadrant: Secondary | ICD-10-CM

## 2016-05-14 ENCOUNTER — Ambulatory Visit
Admission: RE | Admit: 2016-05-14 | Discharge: 2016-05-14 | Disposition: A | Discharge status: Home / Self Care | Payer: BLUE CROSS/BLUE SHIELD | Source: Ambulatory Visit | Attending: Nurse Practitioner | Admitting: Nurse Practitioner

## 2016-05-14 DIAGNOSIS — N6310 Unspecified lump in the right breast, unspecified quadrant: Secondary | ICD-10-CM

## 2016-06-11 ENCOUNTER — Other Ambulatory Visit: Payer: BLUE CROSS/BLUE SHIELD

## 2016-06-11 ENCOUNTER — Ambulatory Visit (INDEPENDENT_AMBULATORY_CARE_PROVIDER_SITE_OTHER): Payer: BLUE CROSS/BLUE SHIELD | Admitting: Nurse Practitioner

## 2016-06-11 ENCOUNTER — Encounter: Payer: Self-pay | Admitting: Nurse Practitioner

## 2016-06-11 VITALS — BP 100/64 | HR 75 | Temp 98.3°F | Ht 63.5 in | Wt 126.0 lb

## 2016-06-11 DIAGNOSIS — N898 Other specified noninflammatory disorders of vagina: Secondary | ICD-10-CM

## 2016-06-11 DIAGNOSIS — L298 Other pruritus: Secondary | ICD-10-CM | POA: Diagnosis not present

## 2016-06-11 LAB — POCT URINALYSIS DIPSTICK
Bilirubin, UA: NEGATIVE
Blood, UA: NEGATIVE
Glucose, UA: NEGATIVE
Ketones, UA: NEGATIVE
LEUKOCYTES UA: NEGATIVE
NITRITE UA: NEGATIVE
PH UA: 6
Spec Grav, UA: >=1.03
UROBILINOGEN UA: 0.2

## 2016-06-11 LAB — WET PREP FOR TRICH, YEAST, CLUE
CLUE CELLS WET PREP: NONE SEEN
TRICH WET PREP: NONE SEEN
YEAST WET PREP: NONE SEEN

## 2016-06-11 MED ORDER — FLUCONAZOLE 150 MG PO TABS: 150.0000 mg | ORAL_TABLET | Freq: Once | ORAL | 0 refills | Status: AC

## 2016-06-11 NOTE — Patient Instructions (Signed)

## 2016-06-11 NOTE — Progress Notes (Signed)
Reviewed with patient in office. See office note

## 2016-06-11 NOTE — Progress Notes (Signed)
Subjective:  Patient ID: Ellen Whitney, female    DOB: 1990/09/19  Age: 25 y.o. MRN: [id number]  CC: Urinary Tract Infection (frequent urinate,itching,white discharge)   Vaginal Itching  The patient's primary symptoms include genital itching and vaginal discharge. The patient's pertinent negatives include no genital lesions, genital odor, genital rash, missed menses, pelvic pain or vaginal bleeding. This is a new problem. The current episode started 1 to 4 weeks ago. The problem occurs constantly. The problem has been waxing and waning. The problem affects both sides. She is not pregnant. Associated symptoms include dysuria. Pertinent negatives include no abdominal pain, anorexia, back pain, chills, constipation, diarrhea, discolored urine, fever, flank pain, frequency, headaches, hematuria, joint pain, joint swelling, nausea, painful intercourse, rash, sore throat, urgency or vomiting. The vaginal discharge was thick and white. There has been no bleeding. Nothing aggravates the symptoms. Treatments tried: metronidazolex4days and miconazole. The treatment provided mild relief. She is not sexually active. No, her partner does not have an STD. She uses nothing for contraception. Her past medical history is significant for vaginosis. There is no history of PID or an STD.  treated by GYN 1week ago, STD testing and PAP smear done 1week ago by GYN, negative STD testing, treated for BV.  Outpatient Medications Prior to Visit  Medication Sig Dispense Refill  . montelukast (SINGULAIR) 10 MG tablet Take 1 tablet (10 mg total) by mouth at bedtime. 30 tablet 3  . sertraline (ZOLOFT) 50 MG tablet Take 1 tablet (50 mg total) by mouth daily. (Patient not taking: Reported on 06/11/2016) 30 tablet 1   No facility-administered medications prior to visit.     ROS See HPI  Objective:  BP 100/64   Pulse 75   Temp 98.3 F (36.8 C)   Ht 5' 3.5" (1.613 m)   Wt 126 lb (57.2 kg)   SpO2 100%   BMI 21.97 kg/m    BP Readings from Last 3 Encounters:  06/11/16 100/64  04/01/16 120/70  03/05/16 114/62    Wt Readings from Last 3 Encounters:  06/11/16 126 lb (57.2 kg)  04/01/16 130 lb (59 kg)  03/05/16 128 lb (58.1 kg)    Physical Exam  Constitutional: She is oriented to person, place, and time. No distress.  Abdominal: Soft. Bowel sounds are normal. She exhibits no distension. There is no tenderness. Hernia confirmed negative in the right inguinal area and confirmed negative in the left inguinal area.  Genitourinary: Vagina normal. Rectal exam shows no external hemorrhoid. No labial fusion. There is no rash, tenderness or lesion on the right labia. There is no rash, tenderness or lesion on the left labia. Cervix exhibits no motion tenderness. Right adnexum displays no tenderness. Left adnexum displays no tenderness.  Genitourinary Comments: White heterogenous vaginal discharge  Lymphadenopathy:       Right: No inguinal adenopathy present.       Left: No inguinal adenopathy present.  Neurological: She is alert and oriented to person, place, and time.  Skin: Skin is warm and dry. No rash noted. No erythema.  Vitals reviewed.   Lab Results  Component Value Date   WBC 7.3 08/27/2015   HGB 14.3 08/27/2015   HCT 41.3 08/27/2015   PLT 257.0 08/27/2015   GLUCOSE 96 08/27/2015   CHOL 196 08/27/2015   TRIG 123.0 08/27/2015   HDL 50.40 08/27/2015   LDLCALC 121 (H) 08/27/2015   ALT 14 08/27/2015   AST 15 08/27/2015   NA 136 08/27/2015   K 3.9 08/27/2015  CL 102 08/27/2015   CREATININE 0.66 08/27/2015   BUN 13 08/27/2015   CO2 27 08/27/2015   TSH 2.72 08/27/2015    [place] Breast Ltd Uni Right Inc Axilla  Result Date: 05/14/2016 CLINICAL DATA:  25 year old female complaining of a palpable abnormality in the right breast. EXAM: ULTRASOUND OF THE RIGHT BREAST COMPARISON:  None. FINDINGS: On physical exam, I palpate soft thickening in the right breast at 6 o'clock 2 cm from the nipple. Targeted  ultrasound is performed, showing normal tissue in the right breast at 6 o'clock 2 cm from the nipple. In the right breast at 6 o'clock in the retroareolar region there is a anechoic measuring 9 x 5 x 7 mm. No solid mass or abnormal shadowing detected. IMPRESSION: Right breast cyst.  No evidence of malignancy. RECOMMENDATION: If the clinical exam remains benign/ stable screening mammography can be deferred until the age of 25. The importance of self-breast examination was discussed with the patient. I have discussed the findings and recommendations with the patient. Results were also provided in writing at the conclusion of the visit. If applicable, a reminder letter will be sent to the patient regarding the next appointment. BI-RADS CATEGORY  2: Benign. Electronically Signed   By: Baird Lyonsina  Arceo M.D.   On: 05/14/2016 13:23    Assessment & Plan:   Ellen Whitney was seen today for urinary tract infection.  Diagnoses and all orders for this visit:  Vaginal discharge -     POCT urinalysis dipstick -     WET PREP FOR TRICH, YEAST, CLUE; Future -     fluconazole (DIFLUCAN) 150 MG tablet; Take 1 tablet (150 mg total) by mouth once.  Itching in the vaginal area -     POCT urinalysis dipstick -     WET PREP FOR TRICH, YEAST, CLUE; Future -     fluconazole (DIFLUCAN) 150 MG tablet; Take 1 tablet (150 mg total) by mouth once.   I am having Ellen Whitney start on fluconazole. I am also having her maintain her montelukast and sertraline.  Meds ordered this encounter  Medications  . fluconazole (DIFLUCAN) 150 MG tablet    Sig: Take 1 tablet (150 mg total) by mouth once.    Dispense:  1 tablet    Refill:  0    Order Specific Question:   Supervising Provider    Answer:   Tresa GarterPLOTNIKOV, ALEKSEI V [[id number]]    Follow-up: No Follow-up on file.  Alysia Pennaharlotte Uchechukwu Dhawan, NP

## 2016-06-11 NOTE — Progress Notes (Signed)
Pre visit review using our clinic review tool, if applicable. No additional management support is needed unless otherwise documented below in the visit note. 

## 2016-07-05 ENCOUNTER — Encounter (HOSPITAL_COMMUNITY): Payer: Self-pay | Admitting: Emergency Medicine

## 2016-07-05 ENCOUNTER — Ambulatory Visit (HOSPITAL_COMMUNITY)
Admission: EM | Admit: 2016-07-05 | Discharge: 2016-07-05 | Disposition: A | Discharge status: Home / Self Care | Payer: BLUE CROSS/BLUE SHIELD | Attending: Family Medicine | Admitting: Family Medicine

## 2016-07-05 DIAGNOSIS — J111 Influenza due to unidentified influenza virus with other respiratory manifestations: Secondary | ICD-10-CM

## 2016-07-05 DIAGNOSIS — R69 Illness, unspecified: Secondary | ICD-10-CM

## 2016-07-05 MED ORDER — IPRATROPIUM BROMIDE 0.06 % NA SOLN: 2.0000 | Freq: Four times a day (QID) | NASAL | 1 refills | Status: DC

## 2016-07-05 MED ORDER — HYDROCODONE-HOMATROPINE 5-1.5 MG/5ML PO SYRP: 5.0000 mL | ORAL_SOLUTION | Freq: Four times a day (QID) | ORAL | 0 refills | Status: DC | PRN

## 2016-07-05 NOTE — ED Provider Notes (Signed)
[facility]    CSN: [id number] Arrival date & time: 07/05/16  1931     History   Chief Complaint Chief Complaint  Patient presents with  . Cough    HPI Ellen Whitney is a 26 y.o. female.   The history is provided by the patient and a relative.  Cough  Cough characteristics:  Non-productive and hacking Severity:  Moderate Onset quality:  Sudden Duration:  1 day Progression:  Unchanged Chronicity:  New Smoker: no   Context: sick contacts   Context comment:  Traveling with sister and both got sick today. Associated symptoms: chest pain, chills, fever and myalgias   Risk factors: recent travel     Past Medical History:  Diagnosis Date  . Allergy   . Anxiety   . Depression   . Eczema     Patient Active Problem List   Diagnosis Date Noted  . Hip flexor tendinitis, right 04/01/2016  . Nonallopathic lesion of lumbosacral region 04/01/2016  . Nonallopathic lesion of sacral region 04/01/2016  . 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 Poor posture 09/17/2015  . Nonallopathic lesion of cervical region 09/17/2015  . Nonallopathic lesion of thoracic region 09/17/2015  . Nonallopathic lesion-rib cage 09/17/2015  . Alopecia 08/27/2015  . Anxiety and depression 03/14/2015  . Seasonal allergies 03/14/2015  . Eczema 03/14/2015    Past Surgical History:  Procedure Laterality Date  . NO PAST SURGERIES      OB History    No data available       Home Medications    Prior to Admission medications   Medication Sig Start Date End Date Taking? Authorizing Provider  benzonatate (TESSALON) 100 MG capsule Take 1 capsule (100 mg total) by mouth 3 (three) times daily as needed for cough. 07/09/16   Anne Ngharlotte Lum Nche, NP  dextromethorphan-guaiFENesin (MUCINEX DM) 30-600 MG 12hr tablet Take 1 tablet by mouth 2 (two) times daily as needed for cough. 07/09/16   Anne Ngharlotte Lum Nche, NP  ipratropium (ATROVENT) 0.06 % nasal spray Place 2 sprays into both nostrils 2 times daily  at 12 noon and 4 pm. 07/09/16   Anne Ngharlotte Lum Nche, NP  sodium chloride (OCEAN) 0.65 % SOLN nasal spray Place 1 spray into both nostrils as needed for congestion. 07/09/16   Anne Ngharlotte Lum Nche, NP    Family History Family History  Problem Relation Age of Onset  . Colon cancer Maternal Grandmother   . Healthy Mother   . Healthy Father   . Thyroid disease Maternal Aunt   . Cancer Paternal Uncle     two great uncles with stomach or colon cancer  . Breast cancer Cousin   . Diabetes Other     runs in family, 2 sisters pre-diabetic    Social History Social History  Substance Use Topics  . Smoking status: Never Smoker  . Smokeless tobacco: Never Used  . Alcohol use No     Allergies   Patient has no known allergies.   Review of Systems Review of Systems  Constitutional: Positive for chills and fever.  Respiratory: Positive for cough.   Cardiovascular: Positive for chest pain.  Musculoskeletal: Positive for myalgias.     Physical Exam Triage Vital Signs ED Triage Vitals  Enc Vitals Group     BP 07/05/16 2021 112/63     Pulse Rate 07/05/16 2021 115     Resp 07/05/16 2021 18     Temp 07/05/16 2021 99 F (37.2 C)     Temp Source 07/05/16  2021 Oral     SpO2 07/05/16 2021 100 %     Weight --      Height --      Head Circumference --      Peak Flow --      Pain Score 07/05/16 2024 7     Pain Loc --      Pain Edu? --      Excl. in GC? --    No data found.   Updated Vital Signs BP 112/63 (BP Location: Left Arm)   Pulse 115   Temp 99 F (37.2 C) (Oral) Comment: Motrin 1 hour ago  Resp 18   SpO2 100%   Visual Acuity Right Eye Distance:   Left Eye Distance:   Bilateral Distance:    Right Eye Near:   Left Eye Near:    Bilateral Near:     Physical Exam   UC Treatments / Results  Labs (all labs ordered are listed, but only abnormal results are displayed) Labs Reviewed - No data to display  EKG  EKG Interpretation None       Radiology No results  found.  Procedures Procedures (including critical care time)  Medications Ordered in UC Medications - No data to display   Initial Impression / Assessment and Plan / UC Course  I have reviewed the triage vital signs and the nursing notes.  Pertinent labs & imaging results that were available during my care of the patient were reviewed by me and considered in my medical decision making (see chart for details).      Final Clinical Impressions(s) / UC Diagnoses   Final diagnoses:  Influenza-like illness    New Prescriptions Discharge Medication List as of 07/05/2016  9:11 PM    START taking these medications   Details  HYDROcodone-homatropine (HYCODAN) 5-1.5 MG/5ML syrup Take 5 mLs by mouth every 6 (six) hours as needed for cough., Starting Sat 07/05/2016, Print    ipratropium (ATROVENT) 0.06 % nasal spray Place 2 sprays into both nostrils 4 (four) times daily., Starting Sat 07/05/2016, Print         Linna Hoff, MD 07/27/16 [phone]

## 2016-07-05 NOTE — Discharge Instructions (Signed)
Drink plenty of fluids as discussed, use medicine as prescribed, and mucinex or delsym for cough. Return or see your doctor if further problems °

## 2016-07-05 NOTE — ED Triage Notes (Signed)
[facility] a [facility].

## 2016-07-09 ENCOUNTER — Encounter: Payer: Self-pay | Admitting: Nurse Practitioner

## 2016-07-09 ENCOUNTER — Ambulatory Visit (INDEPENDENT_AMBULATORY_CARE_PROVIDER_SITE_OTHER): Payer: BLUE CROSS/BLUE SHIELD | Admitting: Nurse Practitioner

## 2016-07-09 VITALS — BP 106/58 | HR 76 | Temp 98.2°F | Resp 16 | Ht 63.5 in | Wt 130.0 lb

## 2016-07-09 DIAGNOSIS — J069 Acute upper respiratory infection, unspecified: Secondary | ICD-10-CM | POA: Diagnosis not present

## 2016-07-09 LAB — POCT INFLUENZA B: RAPID INFLUENZA B AGN: NEGATIVE

## 2016-07-09 MED ORDER — BENZONATATE 100 MG PO CAPS: 100.0000 mg | ORAL_CAPSULE | Freq: Three times a day (TID) | ORAL | 0 refills | Status: DC | PRN

## 2016-07-09 MED ORDER — IPRATROPIUM BROMIDE 0.06 % NA SOLN: 2.0000 | Freq: Two times a day (BID) | NASAL | 0 refills | Status: DC

## 2016-07-09 MED ORDER — SALINE SPRAY 0.65 % NA SOLN: 1.0000 | NASAL | 0 refills | Status: DC | PRN

## 2016-07-09 MED ORDER — DM-GUAIFENESIN ER 30-600 MG PO TB12
1.0000 | ORAL_TABLET | Freq: Two times a day (BID) | ORAL | 0 refills | Status: DC | PRN
Start: 2016-07-09 — End: 2017-03-18

## 2016-07-09 NOTE — Progress Notes (Signed)
Pre visit review using our clinic review tool, if applicable. No additional management support is needed unless otherwise documented below in the visit note. 

## 2016-07-09 NOTE — Patient Instructions (Signed)

## 2016-07-09 NOTE — Progress Notes (Signed)
Reviewed with patient in office. See office note

## 2016-07-09 NOTE — Progress Notes (Signed)
Subjective:  Patient ID: Ellen Whitney, female    DOB: 07/16/1990  Age: 26 y.o. MRN: [id number]  CC: Acute Visit (head congestion and sinus pressure x 4 days)   URI   This is a new problem. The current episode started in the past 7 days. The problem has been unchanged. Associated symptoms include chest pain, congestion, coughing, ear pain, headaches, a plugged ear sensation, rhinorrhea, sinus pain, sneezing, a sore throat and swollen glands. Pertinent negatives include no abdominal pain, nausea, vomiting or wheezing. She has tried acetaminophen, decongestant, increased fluids and NSAIDs for the symptoms. The treatment provided mild relief.    Outpatient Medications Prior to Visit  Medication Sig Dispense Refill  . HYDROcodone-homatropine (HYCODAN) 5-1.5 MG/5ML syrup Take 5 mLs by mouth every 6 (six) hours as needed for cough. (Patient not taking: Reported on 07/09/2016) 120 mL 0  . ipratropium (ATROVENT) 0.06 % nasal spray Place 2 sprays into both nostrils 4 (four) times daily. (Patient not taking: Reported on 07/09/2016) 15 mL 1   No facility-administered medications prior to visit.     ROS See HPI  Objective:  BP (!) 106/58   Pulse 76   Temp 98.2 F (36.8 C) (Oral)   Resp 16   Ht 5' 3.5" (1.613 m)   Wt 130 lb (59 kg)   SpO2 98%   BMI 22.67 kg/m   BP Readings from Last 3 Encounters:  07/09/16 (!) 106/58  07/05/16 112/63  06/11/16 100/64    Wt Readings from Last 3 Encounters:  07/09/16 130 lb (59 kg)  06/11/16 126 lb (57.2 kg)  04/01/16 130 lb (59 kg)    Physical Exam  Constitutional: She is oriented to person, place, and time.  HENT:  Right Ear: Tympanic membrane, external ear and ear canal normal.  Left Ear: Tympanic membrane, external ear and ear canal normal.  Nose: Mucosal edema and rhinorrhea present. Right sinus exhibits maxillary sinus tenderness. Right sinus exhibits no frontal sinus tenderness. Left sinus exhibits maxillary sinus tenderness. Left sinus  exhibits no frontal sinus tenderness.  Mouth/Throat: Uvula is midline. No trismus in the jaw. Posterior oropharyngeal erythema present. No oropharyngeal exudate.  Eyes: No scleral icterus.  Neck: Normal range of motion. Neck supple.  Cardiovascular: Normal rate and normal heart sounds.   Pulmonary/Chest: Effort normal and breath sounds normal.  Musculoskeletal: She exhibits no edema.  Lymphadenopathy:    She has cervical adenopathy.  Neurological: She is alert and oriented to person, place, and time.  Vitals reviewed.   Lab Results  Component Value Date   WBC 7.3 08/27/2015   HGB 14.3 08/27/2015   HCT 41.3 08/27/2015   PLT 257.0 08/27/2015   GLUCOSE 96 08/27/2015   CHOL 196 08/27/2015   TRIG 123.0 08/27/2015   HDL 50.40 08/27/2015   LDLCALC 121 (H) 08/27/2015   ALT 14 08/27/2015   AST 15 08/27/2015   NA 136 08/27/2015   K 3.9 08/27/2015   CL 102 08/27/2015   CREATININE 0.66 08/27/2015   BUN 13 08/27/2015   CO2 27 08/27/2015   TSH 2.72 08/27/2015    No results found.  Assessment & Plan:   Mady Haagensenour was seen today for acute visit.  Diagnoses and all orders for this visit:  Acute URI -     POCT Influenza B -     dextromethorphan-guaiFENesin (MUCINEX DM) 30-600 MG 12hr tablet; Take 1 tablet by mouth 2 (two) times daily as needed for cough. -     sodium chloride (OCEAN)  0.65 % SOLN nasal spray; Place 1 spray into both nostrils as needed for congestion. -     benzonatate (TESSALON) 100 MG capsule; Take 1 capsule (100 mg total) by mouth 3 (three) times daily as needed for cough. -     ipratropium (ATROVENT) 0.06 % nasal spray; Place 2 sprays into both nostrils 2 times daily at 12 noon and 4 pm.   I have discontinued Ms. Osso's HYDROcodone-homatropine. I have also changed her ipratropium. Additionally, I am having her start on dextromethorphan-guaiFENesin, sodium chloride, and benzonatate.  Meds ordered this encounter  Medications  . dextromethorphan-guaiFENesin (MUCINEX  DM) 30-600 MG 12hr tablet    Sig: Take 1 tablet by mouth 2 (two) times daily as needed for cough.    Dispense:  14 tablet    Refill:  0    Order Specific Question:   Supervising Provider    Answer:   Tresa Garter [[id number]]  . sodium chloride (OCEAN) 0.65 % SOLN nasal spray    Sig: Place 1 spray into both nostrils as needed for congestion.    Dispense:  15 mL    Refill:  0    Order Specific Question:   Supervising Provider    Answer:   Tresa Garter [[id number]]  . benzonatate (TESSALON) 100 MG capsule    Sig: Take 1 capsule (100 mg total) by mouth 3 (three) times daily as needed for cough.    Dispense:  20 capsule    Refill:  0    Order Specific Question:   Supervising Provider    Answer:   Tresa Garter [[id number]]  . ipratropium (ATROVENT) 0.06 % nasal spray    Sig: Place 2 sprays into both nostrils 2 times daily at 12 noon and 4 pm.    Dispense:  15 mL    Refill:  0    Order Specific Question:   Supervising Provider    Answer:   Tresa Garter [[id number]]    Follow-up: Return if symptoms worsen or fail to improve.  Alysia Penna, NP

## 2016-12-17 ENCOUNTER — Ambulatory Visit (INDEPENDENT_AMBULATORY_CARE_PROVIDER_SITE_OTHER): Payer: BLUE CROSS/BLUE SHIELD | Admitting: Family Medicine

## 2016-12-17 ENCOUNTER — Encounter: Payer: Self-pay | Admitting: Family Medicine

## 2016-12-17 VITALS — BP 112/76 | HR 104 | Temp 99.2°F | Ht 63.5 in | Wt 125.0 lb

## 2016-12-17 DIAGNOSIS — K529 Noninfective gastroenteritis and colitis, unspecified: Secondary | ICD-10-CM | POA: Diagnosis not present

## 2016-12-17 MED ORDER — PROMETHAZINE HCL 25 MG PO TABS: 25.0000 mg | ORAL_TABLET | ORAL | 0 refills | Status: DC | PRN

## 2016-12-17 NOTE — Patient Instructions (Signed)
WE NOW OFFER   [facility] FAST TRACK!!!  SAME DAY Appointments for ACUTE CARE  Such as: Sprains, Injuries, cuts, abrasions, rashes, muscle pain, joint pain, back pain Colds, flu, sore throats, headache, allergies, cough, fever  Ear pain, sinus and eye infections Abdominal pain, nausea, vomiting, diarrhea, upset stomach Animal/insect bites  3 Easy Ways to Schedule: Walk-In Scheduling Call in scheduling Mychart Sign-up: [email or web address]         

## 2016-12-17 NOTE — Progress Notes (Signed)
   Subjective:    Patient ID: Ellen Whitney, female    DOB: March 16, 1991, 26 y.o.   MRN: [id number]  HPI Here for the onset this am of mild abdominal cramps, nausea, and vomiting. No diarrhea. No fever. She has not been able to keep liquids down today. No recent travel.    Review of Systems  Constitutional: Negative.   Respiratory: Negative.   Cardiovascular: Negative.   Gastrointestinal: Positive for abdominal pain, nausea and vomiting. Negative for abdominal distention, anal bleeding, blood in stool, constipation and diarrhea.  Genitourinary: Negative.        Objective:   Physical Exam  Constitutional: She is oriented to person, place, and time. She appears well-developed and well-nourished. No distress.  Neck: No thyromegaly present.  Cardiovascular: Normal rate, regular rhythm, normal heart sounds and intact distal pulses.   Pulmonary/Chest: Effort normal and breath sounds normal. No respiratory distress. She has no wheezes. She has no rales.  Abdominal: Soft. Bowel sounds are normal. She exhibits no distension and no mass. There is no rebound and no guarding.  Mild generalized tenderness   Lymphadenopathy:    She has no cervical adenopathy.  Neurological: She is alert and oriented to person, place, and time.          Assessment & Plan:  Enteritis, use Phenergan for nausea. sip water and Gatorade. Written out of work today and tomorrow.  Gershon CraneStephen Annali Lybrand, MD

## 2017-03-18 ENCOUNTER — Encounter: Payer: Self-pay | Admitting: Internal Medicine

## 2017-03-18 ENCOUNTER — Ambulatory Visit (INDEPENDENT_AMBULATORY_CARE_PROVIDER_SITE_OTHER): Payer: BLUE CROSS/BLUE SHIELD | Admitting: Internal Medicine

## 2017-03-18 VITALS — BP 120/84 | HR 64 | Temp 98.4°F | Resp 16 | Ht 63.5 in | Wt 138.0 lb

## 2017-03-18 DIAGNOSIS — N6001 Solitary cyst of right breast: Secondary | ICD-10-CM | POA: Insufficient documentation

## 2017-03-18 DIAGNOSIS — R1013 Epigastric pain: Secondary | ICD-10-CM | POA: Insufficient documentation

## 2017-03-18 DIAGNOSIS — Z23 Encounter for immunization: Secondary | ICD-10-CM | POA: Diagnosis not present

## 2017-03-18 DIAGNOSIS — Z0000 Encounter for general adult medical examination without abnormal findings: Secondary | ICD-10-CM

## 2017-03-18 NOTE — Assessment & Plan Note (Signed)
?   Inc in size, has some increase in pain Will order another US of the breast ? Need a mammogram Advised her to decrease caffeine intake

## 2017-03-18 NOTE — Progress Notes (Signed)
Subjective:    Patient ID: Ellen Whitney, female    DOB: 1990-12-23, 26 y.o.   MRN: [id number]  HPI She is here for a physical exam.   Right sided breast pain and tenderness with palpation.  She had an [place] in 04/2016 and was thought to have a cyst.  She has had increased pain in the right breast.  She started drinking coffee recently.  After further questioning she is taking a fat burner than contains caffeine as well.  She thinks the cyst has gotten bigger.  She has just finished her menses.    Medications and allergies reviewed with patient and updated if appropriate.  Patient Active Problem List   Diagnosis Date Noted  . Breast cyst, right 03/18/2017  . Hip flexor tendinitis, right 04/01/2016  . Nonallopathic lesion of lumbosacral region 04/01/2016  . Nonallopathic lesion of sacral region 04/01/2016  . 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 Poor posture 09/17/2015  . Nonallopathic lesion of cervical region 09/17/2015  . Nonallopathic lesion of thoracic region 09/17/2015  . Nonallopathic lesion-rib cage 09/17/2015  . Alopecia 08/27/2015  . Anxiety and depression 03/14/2015  . Seasonal allergies 03/14/2015  . Eczema 03/14/2015    No current outpatient prescriptions on file prior to visit.   No current facility-administered medications on file prior to visit.     Past Medical History:  Diagnosis Date  . Allergy   . Anxiety   . Depression   . Eczema     Past Surgical History:  Procedure Laterality Date  . NO PAST SURGERIES      Social History   Social History  . Marital status: Single    Spouse name: N/A  . Number of children: 0  . Years of education: 81   Occupational History  . 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 Smoking status: Never Smoker  . Smokeless tobacco: Never Used  . Alcohol use No  . Drug use: No  . Sexual activity: Yes    Birth control/ protection: None   Other Topics Concern  . None   Social  History Narrative   Denies religious beliefs effecting health care.   Denies abuse and feels safe where she lives.          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 Colon cancer Maternal Grandmother   . Healthy Mother   . Healthy Father   . Thyroid disease Maternal Aunt   . Cancer Paternal Uncle        two great uncles with stomach or colon cancer  . Breast cancer Cousin   . Diabetes Other        runs in family, 2 sisters pre-diabetic    Review of Systems  Constitutional: Positive for fatigue (intermittent). Negative for chills and fever.  HENT: Negative for sore throat (? mildy scratchy throat).   Eyes: Positive for visual disturbance (blurry at times - from being on computer all day).  Respiratory: Positive for cough (after exercise). Negative for shortness of breath and wheezing.   Cardiovascular: Negative for chest pain, palpitations and leg swelling.  Gastrointestinal: Positive for abdominal pain (epigastric pain with eating at times). Negative for blood in stool, constipation, diarrhea and nausea.       Occ GERD  Genitourinary: Negative for dysuria and  hematuria.  Skin: Negative for color change and rash.  Neurological: Positive for headaches. Negative for light-headedness.  Psychiatric/Behavioral: Negative for dysphoric mood. The patient is not nervous/anxious.        Objective:   Vitals:   03/18/17 1412  BP: 120/84  Pulse: 64  Resp: 16  Temp: 98.4 F (36.9 C)  SpO2: 99%   Filed Weights   03/18/17 1412  Weight: 138 lb (62.6 kg)   Body mass index is 24.06 kg/m.  Wt Readings from Last 3 Encounters:  03/18/17 138 lb (62.6 kg)  12/17/16 125 lb (56.7 kg)  07/09/16 130 lb (59 kg)     Physical Exam Constitutional: She appears well-developed and well-nourished. No distress.  HENT:  Head: Normocephalic and atraumatic.  Right Ear: External ear normal. Normal  ear canal and TM Left Ear: External ear normal.  Normal ear canal and TM Mouth/Throat: Oropharynx is clear and moist.  Eyes: Conjunctivae and EOM are normal.  Neck: Neck supple. No tracheal deviation present. No thyromegaly present.  No carotid bruit  Cardiovascular: Normal rate, regular rhythm and normal heart sounds.   No murmur heard.  No edema. Pulmonary/Chest: Effort normal and breath sounds normal. No respiratory distress. She has no wheezes. She has no rales.  Breast: right breast: three areas of tenderness where there are palpable lumps - 1, 4 and 9 o'clock, no nipple discharge, no skin changes or axillary lymphadenopathy; left breast with no lumps  nipple discharge, skin changes or axillary lymphadenopathy Abdominal: Soft. She exhibits no distension. There is no tenderness.  Lymphadenopathy: She has no cervical adenopathy.  Skin: Skin is warm and dry. She is not diaphoretic.  Psychiatric: She has a normal mood and affect. Her behavior is normal.         Assessment & Plan:   Physical exam: Screening blood work  - deferred - labs normal last year, not indicated Immunizations  Flu vaccine today Gyn - not up to date - advised to schedule - has seen them in the past, but not recently, not sexually active Exercise  - regular Weight  BMI is good.  Skin   No concerns Substance abuse   none  See Problem List for Assessment and Plan of chronic medical problems.

## 2017-03-18 NOTE — Assessment & Plan Note (Signed)
Intermittent epigastric pain with eating sometimes - she is unsure if it is certain foods, etc -- will start to keep a better log of when symptoms occur She has had this pain for over one year so I will refer to GI for further evaluation

## 2017-03-18 NOTE — Patient Instructions (Addendum)
Decrease your caffeine intake   All other Health Maintenance issues reviewed.   All recommended immunizations and age-appropriate screenings are up-to-date or discussed.  Flu immunization administered today.   Medications reviewed and updated.  No changes recommended at this time.   A breast ultrasound was ordered.   Please followup in one year   Health Maintenance, Female Adopting a healthy lifestyle and getting preventive care can go a long way to promote health and wellness. Talk with your health care provider about what schedule of regular examinations is right for you. This is a good chance for you to check in with your provider about disease prevention and staying healthy. In between checkups, there are plenty of things you can do on your own. Experts have done a lot of research about which lifestyle changes and preventive measures are most likely to keep you healthy. Ask your health care provider for more information. Weight and diet Eat a healthy diet  Be sure to include plenty of vegetables, fruits, low-fat dairy products, and lean protein.  Do not eat a lot of foods high in solid fats, added sugars, or salt.  Get regular exercise. This is one of the most important things you can do for your health. ? Most adults should exercise for at least 150 minutes each week. The exercise should increase your heart rate and make you sweat (moderate-intensity exercise). ? Most adults should also do strengthening exercises at least twice a week. This is in addition to the moderate-intensity exercise.  Maintain a healthy weight  Body mass index (BMI) is a measurement that can be used to identify possible weight problems. It estimates body fat based on height and weight. Your health care provider can help determine your BMI and help you achieve or maintain a healthy weight.  For females 97 years of age and older: ? A BMI below 18.5 is considered underweight. ? A BMI of 18.5 to 24.9 is  normal. ? A BMI of 25 to 29.9 is considered overweight. ? A BMI of 30 and above is considered obese.  Watch levels of cholesterol and blood lipids  You should start having your blood tested for lipids and cholesterol at 25 years of age, then have this test every 5 years.  You may need to have your cholesterol levels checked more often if: ? Your lipid or cholesterol levels are high. ? You are older than 25 years of age. ? You are at high risk for heart disease.  Cancer screening Lung Cancer  Lung cancer screening is recommended for adults 52-80 years old who are at high risk for lung cancer because of a history of smoking.  A yearly low-dose CT scan of the lungs is recommended for people who: ? Currently smoke. ? Have quit within the past 15 years. ? Have at least a 30-pack-year history of smoking. A pack year is smoking an average of one pack of cigarettes a day for 1 year.  Yearly screening should continue until it has been 15 years since you quit.  Yearly screening should stop if you develop a health problem that would prevent you from having lung cancer treatment.  Breast Cancer  Practice breast self-awareness. This means understanding how your breasts normally appear and feel.  It also means doing regular breast self-exams. Let your health care provider know about any changes, no matter how small.  If you are in your 20s or 30s, you should have a clinical breast exam (CBE) by a health care  provider every 1-3 years as part of a regular health exam.  If you are 96 or older, have a CBE every year. Also consider having a breast X-ray (mammogram) every year.  If you have a family history of breast cancer, talk to your health care provider about genetic screening.  If you are at high risk for breast cancer, talk to your health care provider about having an MRI and a mammogram every year.  Breast cancer gene (BRCA) assessment is recommended for women who have family members  with BRCA-related cancers. BRCA-related cancers include: ? Breast. ? Ovarian. ? Tubal. ? Peritoneal cancers.  Results of the assessment will determine the need for genetic counseling and BRCA1 and BRCA2 testing.  Cervical Cancer Your health care provider may recommend that you be screened regularly for cancer of the pelvic organs (ovaries, uterus, and vagina). This screening involves a pelvic examination, including checking for microscopic changes to the surface of your cervix (Pap test). You may be encouraged to have this screening done every 3 years, beginning at age 64.  For women ages 74-65, health care providers may recommend pelvic exams and Pap testing every 3 years, or they may recommend the Pap and pelvic exam, combined with testing for human papilloma virus (HPV), every 5 years. Some types of HPV increase your risk of cervical cancer. Testing for HPV may also be done on women of any age with unclear Pap test results.  Other health care providers may not recommend any screening for nonpregnant women who are considered low risk for pelvic cancer and who do not have symptoms. Ask your health care provider if a screening pelvic exam is right for you.  If you have had past treatment for cervical cancer or a condition that could lead to cancer, you need Pap tests and screening for cancer for at least 20 years after your treatment. If Pap tests have been discontinued, your risk factors (such as having a new sexual partner) need to be reassessed to determine if screening should resume. Some women have medical problems that increase the chance of getting cervical cancer. In these cases, your health care provider may recommend more frequent screening and Pap tests.  Colorectal Cancer  This type of cancer can be detected and often prevented.  Routine colorectal cancer screening usually begins at 25 years of age and continues through 25 years of age.  Your health care provider may recommend  screening at an earlier age if you have risk factors for colon cancer.  Your health care provider may also recommend using home test kits to check for hidden blood in the stool.  A small camera at the end of a tube can be used to examine your colon directly (sigmoidoscopy or colonoscopy). This is done to check for the earliest forms of colorectal cancer.  Routine screening usually begins at age 69.  Direct examination of the colon should be repeated every 5-10 years through 25 years of age. However, you may need to be screened more often if early forms of precancerous polyps or small growths are found.  Skin Cancer  Check your skin from head to toe regularly.  Tell your health care provider about any new moles or changes in moles, especially if there is a change in a mole's shape or color.  Also tell your health care provider if you have a mole that is larger than the size of a pencil eraser.  Always use sunscreen. Apply sunscreen liberally and repeatedly throughout the day.  Protect yourself by wearing long sleeves, pants, a wide-brimmed hat, and sunglasses whenever you are outside.  Heart disease, diabetes, and high blood pressure  High blood pressure causes heart disease and increases the risk of stroke. High blood pressure is more likely to develop in: ? People who have blood pressure in the high end of the normal range (130-139/85-89 mm Hg). ? People who are overweight or obese. ? People who are African American.  If you are 65-86 years of age, have your blood pressure checked every 3-5 years. If you are 51 years of age or older, have your blood pressure checked every year. You should have your blood pressure measured twice-once when you are at a hospital or clinic, and once when you are not at a hospital or clinic. Record the average of the two measurements. To check your blood pressure when you are not at a hospital or clinic, you can use: ? An automated blood pressure machine at  a pharmacy. ? A home blood pressure monitor.  If you are between 31 years and 56 years old, ask your health care provider if you should take aspirin to prevent strokes.  Have regular diabetes screenings. This involves taking a blood sample to check your fasting blood sugar level. ? If you are at a normal weight and have a low risk for diabetes, have this test once every three years after 25 years of age. ? If you are overweight and have a high risk for diabetes, consider being tested at a younger age or more often. Preventing infection Hepatitis B  If you have a higher risk for hepatitis B, you should be screened for this virus. You are considered at high risk for hepatitis B if: ? You were born in a country where hepatitis B is common. Ask your health care provider which countries are considered high risk. ? Your parents were born in a high-risk country, and you have not been immunized against hepatitis B (hepatitis B vaccine). ? You have HIV or AIDS. ? You use needles to inject street drugs. ? You live with someone who has hepatitis B. ? You have had sex with someone who has hepatitis B. ? You get hemodialysis treatment. ? You take certain medicines for conditions, including cancer, organ transplantation, and autoimmune conditions.  Hepatitis C  Blood testing is recommended for: ? Everyone born from [birth year] through 1964. ? Anyone with known risk factors for hepatitis C.  Sexually transmitted infections (STIs)  You should be screened for sexually transmitted infections (STIs) including gonorrhea and chlamydia if: ? You are sexually active and are younger than 25 years of age. ? You are older than 25 years of age and your health care provider tells you that you are at risk for this type of infection. ? Your sexual activity has changed since you were last screened and you are at an increased risk for chlamydia or gonorrhea. Ask your health care provider if you are at risk.  If you do  not have HIV, but are at risk, it may be recommended that you take a prescription medicine daily to prevent HIV infection. This is called pre-exposure prophylaxis (PrEP). You are considered at risk if: ? You are sexually active and do not regularly use condoms or know the HIV status of your partner(s). ? You take drugs by injection. ? You are sexually active with a partner who has HIV.  Talk with your health care provider about whether you are at high risk of  being infected with HIV. If you choose to begin PrEP, you should first be tested for HIV. You should then be tested every 3 months for as long as you are taking PrEP. Pregnancy  If you are premenopausal and you may become pregnant, ask your health care provider about preconception counseling.  If you may become pregnant, take 400 to 800 micrograms (mcg) of folic acid every day.  If you want to prevent pregnancy, talk to your health care provider about birth control (contraception). Osteoporosis and menopause  Osteoporosis is a disease in which the bones lose minerals and strength with aging. This can result in serious bone fractures. Your risk for osteoporosis can be identified using a bone density scan.  If you are 46 years of age or older, or if you are at risk for osteoporosis and fractures, ask your health care provider if you should be screened.  Ask your health care provider whether you should take a calcium or vitamin D supplement to lower your risk for osteoporosis.  Menopause may have certain physical symptoms and risks.  Hormone replacement therapy may reduce some of these symptoms and risks. Talk to your health care provider about whether hormone replacement therapy is right for you. Follow these instructions at home:  Schedule regular health, dental, and eye exams.  Stay current with your immunizations.  Do not use any tobacco products including cigarettes, chewing tobacco, or electronic cigarettes.  If you are  pregnant, do not drink alcohol.  If you are breastfeeding, limit how much and how often you drink alcohol.  Limit alcohol intake to no more than 1 drink per day for nonpregnant women. One drink equals 12 ounces of beer, 5 ounces of wine, or 1 ounces of hard liquor.  Do not use street drugs.  Do not share needles.  Ask your health care provider for help if you need support or information about quitting drugs.  Tell your health care provider if you often feel depressed.  Tell your health care provider if you have ever been abused or do not feel safe at home. This information is not intended to replace advice given to you by your health care provider. Make sure you discuss any questions you have with your health care provider. Document Released: 12/30/2010 Document Revised: 11/22/2015 Document Reviewed: 03/20/2015 Elsevier Interactive Patient Education  [employer].

## 2017-03-19 ENCOUNTER — Other Ambulatory Visit: Payer: Self-pay | Admitting: Internal Medicine

## 2017-03-19 DIAGNOSIS — N6001 Solitary cyst of right breast: Secondary | ICD-10-CM

## 2017-03-23 ENCOUNTER — Other Ambulatory Visit: Payer: Self-pay

## 2017-03-27 ENCOUNTER — Ambulatory Visit
Admission: RE | Admit: 2017-03-27 | Discharge: 2017-03-27 | Disposition: A | Discharge status: Home / Self Care | Payer: BLUE CROSS/BLUE SHIELD | Source: Ambulatory Visit | Attending: Internal Medicine | Admitting: Internal Medicine

## 2017-03-27 ENCOUNTER — Other Ambulatory Visit: Payer: Self-pay | Admitting: Internal Medicine

## 2017-03-27 ENCOUNTER — Ambulatory Visit: Admission: RE | Admit: 2017-03-27 | Payer: Self-pay | Source: Ambulatory Visit

## 2017-03-27 DIAGNOSIS — N6310 Unspecified lump in the right breast, unspecified quadrant: Secondary | ICD-10-CM

## 2017-03-27 DIAGNOSIS — N6001 Solitary cyst of right breast: Secondary | ICD-10-CM

## 2017-04-07 ENCOUNTER — Ambulatory Visit
Admission: RE | Admit: 2017-04-07 | Discharge: 2017-04-07 | Disposition: A | Discharge status: Home / Self Care | Payer: BLUE CROSS/BLUE SHIELD | Source: Ambulatory Visit | Attending: Internal Medicine | Admitting: Internal Medicine

## 2017-04-07 DIAGNOSIS — N6310 Unspecified lump in the right breast, unspecified quadrant: Secondary | ICD-10-CM

## 2017-05-12 ENCOUNTER — Ambulatory Visit: Payer: Self-pay | Admitting: Gastroenterology

## 2018-01-06 ENCOUNTER — Ambulatory Visit: Payer: Self-pay | Admitting: Internal Medicine

## 2018-03-03 ENCOUNTER — Other Ambulatory Visit (INDEPENDENT_AMBULATORY_CARE_PROVIDER_SITE_OTHER): Payer: Managed Care, Other (non HMO)

## 2018-03-03 ENCOUNTER — Encounter: Payer: Self-pay | Admitting: Internal Medicine

## 2018-03-03 ENCOUNTER — Ambulatory Visit: Payer: Self-pay | Admitting: *Deleted

## 2018-03-03 ENCOUNTER — Ambulatory Visit (INDEPENDENT_AMBULATORY_CARE_PROVIDER_SITE_OTHER): Payer: Managed Care, Other (non HMO) | Admitting: Internal Medicine

## 2018-03-03 VITALS — BP 140/80 | HR 76 | Temp 98.8°F | Resp 16 | Ht 63.5 in | Wt 142.0 lb

## 2018-03-03 DIAGNOSIS — R002 Palpitations: Secondary | ICD-10-CM | POA: Insufficient documentation

## 2018-03-03 DIAGNOSIS — R51 Headache: Secondary | ICD-10-CM

## 2018-03-03 DIAGNOSIS — K219 Gastro-esophageal reflux disease without esophagitis: Secondary | ICD-10-CM | POA: Diagnosis not present

## 2018-03-03 DIAGNOSIS — S46811A Strain of other muscles, fascia and tendons at shoulder and upper arm level, right arm, initial encounter: Secondary | ICD-10-CM | POA: Diagnosis not present

## 2018-03-03 DIAGNOSIS — R519 Headache, unspecified: Secondary | ICD-10-CM | POA: Insufficient documentation

## 2018-03-03 DIAGNOSIS — R82998 Other abnormal findings in urine: Secondary | ICD-10-CM | POA: Diagnosis not present

## 2018-03-03 DIAGNOSIS — G4489 Other headache syndrome: Secondary | ICD-10-CM

## 2018-03-03 LAB — COMPREHENSIVE METABOLIC PANEL
ALT: 24 U/L (ref 0–35)
AST: 23 U/L (ref 0–37)
Albumin: 4.8 g/dL (ref 3.5–5.2)
Alkaline Phosphatase: 50 U/L (ref 39–117)
BILIRUBIN TOTAL: 0.5 mg/dL (ref 0.2–1.2)
BUN: 17 mg/dL (ref 6–23)
CALCIUM: 10 mg/dL (ref 8.4–10.5)
CO2: 28 meq/L (ref 19–32)
CREATININE: 0.8 mg/dL (ref 0.40–1.20)
Chloride: 103 mEq/L (ref 96–112)
GFR: 91.84 mL/min (ref >60.00)
GLUCOSE: 92 mg/dL (ref 70–99)
Potassium: 4.7 mEq/L (ref 3.5–5.1)
Sodium: 138 mEq/L (ref 135–145)
Total Protein: 8.5 g/dL — ABNORMAL HIGH (ref 6.0–8.3)

## 2018-03-03 LAB — CBC WITH DIFFERENTIAL/PLATELET
BASOS ABS: 0.1 10*3/uL (ref 0.0–0.1)
Basophils Relative: 0.7 % (ref 0.0–3.0)
Eosinophils Absolute: 0.2 10*3/uL (ref 0.0–0.7)
Eosinophils Relative: 2.2 % (ref 0.0–5.0)
HCT: 40.9 % (ref 36.0–46.0)
Hemoglobin: 14.1 g/dL (ref 12.0–15.0)
LYMPHS ABS: 3.1 10*3/uL (ref 0.7–4.0)
LYMPHS PCT: 38.1 % (ref 12.0–46.0)
MCHC: 34.5 g/dL (ref 30.0–36.0)
MCV: 91.5 fl (ref 78.0–100.0)
MONOS PCT: 6.8 % (ref 3.0–12.0)
Monocytes Absolute: 0.6 10*3/uL (ref 0.1–1.0)
NEUTROS ABS: 4.3 10*3/uL (ref 1.4–7.7)
NEUTROS PCT: 52.2 % (ref 43.0–77.0)
PLATELETS: 274 10*3/uL (ref 150.0–400.0)
RBC: 4.48 Mil/uL (ref 3.87–5.11)
RDW: 12.3 % (ref 11.5–15.5)
WBC: 8.1 10*3/uL (ref 4.0–10.5)

## 2018-03-03 LAB — URINALYSIS, ROUTINE W REFLEX MICROSCOPIC
Hgb urine dipstick: NEGATIVE
Ketones, ur: 15 — AB
Leukocytes, UA: NEGATIVE
Nitrite: NEGATIVE
RBC / HPF: NONE SEEN (ref 0–?)
TOTAL PROTEIN, URINE-UPE24: NEGATIVE
URINE GLUCOSE: NEGATIVE
Urobilinogen, UA: 0.2 (ref 0.0–1.0)
pH: 6 (ref 5.0–8.0)

## 2018-03-03 LAB — TSH: TSH: 1.16 u[IU]/mL (ref 0.35–4.50)

## 2018-03-03 MED ORDER — METHOCARBAMOL 500 MG PO TABS: 500.0000 mg | ORAL_TABLET | Freq: Three times a day (TID) | ORAL | 0 refills | Status: DC | PRN

## 2018-03-03 MED ORDER — OMEPRAZOLE 20 MG PO CPDR: 20.0000 mg | DELAYED_RELEASE_CAPSULE | Freq: Every day | ORAL | 0 refills | Status: DC

## 2018-03-03 NOTE — Assessment & Plan Note (Signed)
Likely stress related Will check CBC, CMP and TSH Discussed stress management

## 2018-03-03 NOTE — Assessment & Plan Note (Signed)
She has been experiencing GERD for a while-likely stress related Start omeprazole 20 mg daily for 2-4 weeks and then discontinue If GERD and nausea do not improve she may need further evaluation

## 2018-03-03 NOTE — Telephone Encounter (Signed)
You are seeing pt today at 3. Just FYI about what is going on.

## 2018-03-03 NOTE — Assessment & Plan Note (Signed)
Her trapezius muscle on the right is very tight and tender and could be causing a pinched nerve which is the numbness in the right arm We will try a muscle relaxer Right shoulder has full range of motion and no neck pain, so I doubt cervical radiculopathy If no improvement with the muscle relaxer will refer to sports medicine

## 2018-03-03 NOTE — Telephone Encounter (Signed)
Pt called with having pain above her right breast area. The pain is continuously and does not get worse or better with movement or taking deep breaths. She denies fever today but felt hot yesterday. She has been nauseated the past few days but contribute that to not eating enough. No hx of lung, blood clot or cardiac disease. She also has numbness and tingling in the right arm on the inside. She denies pain in the arm.  No change in color noted in hand or arm. She can use the hand and arm without difficulty.  Appointment scheduled for today with her provider.  Advised that if she started experiencing more symptoms including: headache, blurred vision, sweating, dizziness or increased in the numbness to go to the closest emergency department. Pt voiced understanding.  Reason for Disposition . [1] Numbness (i.e., loss of sensation) of the face, arm / hand, or leg / foot on one side of the body AND [2] gradual onset (e.g., days to weeks) AND [3] present now  Answer Assessment - Initial Assessment Questions 1. LOCATION: "Where does it hurt?"       Above the right breast closest to the right side 2. RADIATION: "Does the pain go anywhere else?" (e.g., into neck, jaw, arms, back)     no 3. ONSET: "When did the chest pain begin?" (Minutes, hours or days)      This ;morning 4. PATTERN "Does the pain come and go, or has it been constant since it started?"  "Does it get worse with exertion?"      Constant whether moving or not 5. DURATION: "How long does it last" (e.g., seconds, minutes, hours)     constant 6. SEVERITY: "How bad is the pain?"  (e.g., Scale 1-10; mild, moderate, or severe)    - MILD (1-3): doesn't interfere with normal activities     - MODERATE (4-7): interferes with normal activities or awakens from sleep    - SEVERE (8-10): excruciating pain, unable to do any normal activities       Pain # 5 7. CARDIAC RISK FACTORS: "Do you have any history of heart problems or risk factors for heart  disease?" (e.g., prior heart attack, angina; high blood pressure, diabetes, being overweight, high cholesterol, smoking, or strong family history of heart disease)     no 8. PULMONARY RISK FACTORS: "Do you have any history of lung disease?"  (e.g., blood clots in lung, asthma, emphysema, birth control pills)     no 9. CAUSE: "What do you think is causing the chest pain?"     Not sure  10. OTHER SYMPTOMS: "Do you have any other symptoms?" (e.g., dizziness, nausea, vomiting, sweating, fever, difficulty breathing, cough)       nauseous the past few days, hot yesterday, some shortness of breath,  11. PREGNANCY: "Is there any chance you are pregnant?" "When was your last menstrual period?"       No, LMP just finished  Protocols used: NEUROLOGIC DEFICIT-A-AH, CHEST PAIN-A-AH

## 2018-03-03 NOTE — Assessment & Plan Note (Signed)
She has been experiencing more frequent headaches Likely related to dehydration, not eating well and stress The above is likely causing some of her other symptoms as well Discussed stress management We will check basic blood work and urine to rule out other causes Call if no improvement

## 2018-03-03 NOTE — Progress Notes (Signed)
Subjective:    Patient ID: Ellen Whitney, female    DOB: 05-24-1991, 27 y.o.   MRN: [id number]  HPI The patient is here for an acute visit.  Has not been to the gym.  Has not lifted anything.  He denies any new activities or injuries.  Today when she first woke up she felt fine.  When she was at work she started having right upper chest throbbing type pain that radiated down her right arm.  She then had numbness that felt the same path from her right chest/armpit area down the anterior aspect of her arm to her palm.  She states that has improved, but she still feels it.  She feels like her right shoulder has minimally decreased range of motion-it just feels different.  She has pain in her right upper and mid back-the muscles are all tight.  She had an episode of palpitations today and overall she just has not felt well.  The past couple of days her urine has been very dark yellow.  She denies any dysuria, hematuria or increased frequency of urination.  Her while she has been having heartburn and nausea.  She is concerned about her memory.  She is forgetting to do things and often cannot remember her "to do" list.  This is very unusual for her.  She has a family history of Alzheimer's disease and is concerned she may be getting it.  She denies any new medications, supplements or diet.  She is under increased stress.  She is in the process of moving and states the stress level has been high.     Medications and allergies reviewed with patient and updated if appropriate.  Patient Active Problem List   Diagnosis Date Noted  . Breast cyst, right 03/18/2017  . Epigastric pain 03/18/2017  . Hip flexor tendinitis, right 04/01/2016  . Nonallopathic lesion of lumbosacral region 04/01/2016  . Nonallopathic lesion of sacral region 04/01/2016  . 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 Poor posture 09/17/2015  . Nonallopathic lesion of cervical region 09/17/2015  . Nonallopathic lesion of thoracic  region 09/17/2015  . Nonallopathic lesion-rib cage 09/17/2015  . Alopecia 08/27/2015  . Anxiety and depression 03/14/2015  . Seasonal allergies 03/14/2015  . Eczema 03/14/2015    No current outpatient medications on file prior to visit.   No current facility-administered medications on file prior to visit.     Past Medical History:  Diagnosis Date  . Allergy   . Anxiety   . Depression   . Eczema     Past Surgical History:  Procedure Laterality Date  . NO PAST SURGERIES      Social History   Socioeconomic History  . Marital status: Single    Spouse name: Not on file  . Number of children: 0  . Years of education: 92  . Highest education level: Not on file  Occupational History  . 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 Financial resource strain: Not on file  . Food insecurity:    Worry: Not on file    Inability: Not on file  . Transportation needs:    Medical: Not on file    Non-medical: Not on file  Tobacco Use  . Smoking status: Never Smoker  . Smokeless tobacco: Never Used  Substance and Sexual Activity  . Alcohol use: No    Alcohol/week: 0.0 standard drinks  . Drug use: No  . Sexual activity:  Yes    Birth control/protection: None  Lifestyle  . Physical activity:    Days per week: Not on file    Minutes per session: Not on file  . Stress: Not on file  Relationships  . Social connections:    Talks on phone: Not on file    Gets together: Not on file    Attends religious service: Not on file    Active member of club or organization: Not on file    Attends meetings of clubs or organizations: Not on file    Relationship status: Not on file  Other Topics Concern  . Not on file  Social History Narrative   Denies religious beliefs effecting health care.   Denies abuse and feels safe where she lives.          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 Colon cancer Maternal Grandmother   . Healthy Mother   . Healthy Father   . Thyroid disease Maternal Aunt   . Cancer Paternal Uncle        two great uncles with stomach or colon cancer  . Breast cancer Cousin   . Diabetes Other        runs in family, 2 sisters pre-diabetic    Review of Systems  Constitutional: Positive for chills, diaphoresis and fatigue. Negative for fever.  Eyes: Positive for visual disturbance (blurry vision - x 1-2 months).  Respiratory: Positive for shortness of breath (when she was talking at work earlier today - not today). Negative for cough and wheezing.   Cardiovascular: Positive for chest pain and palpitations. Negative for leg swelling.  Gastrointestinal: Positive for nausea. Negative for abdominal pain.  Genitourinary: Positive for pelvic pain (right pelvis). Negative for dysuria, frequency and hematuria.       Dark yellow urine  Musculoskeletal: Positive for back pain (right upper back and mid back pain).  Neurological: Positive for numbness and headaches.  Psychiatric/Behavioral:       Memory concerns       Objective:   Vitals:   03/03/18 1527  BP: 140/80  Pulse: 76  Resp: 16  Temp: 98.8 F (37.1 C)  SpO2: 98%   BP Readings from Last 3 Encounters:  03/03/18 140/80  03/18/17 120/84  12/17/16 112/76   Wt Readings from Last 3 Encounters:  03/03/18 142 lb (64.4 kg)  03/18/17 138 lb (62.6 kg)  12/17/16 125 lb (56.7 kg)   Body mass index is 24.76 kg/m.   Physical Exam  Constitutional: She appears well-developed and well-nourished. No distress.  HENT:  Head: Normocephalic and atraumatic.  Neck: Neck supple. No tracheal deviation present. No thyromegaly present.  Cardiovascular: Normal rate, regular rhythm and normal heart sounds.  No murmur heard. Pulmonary/Chest: Effort normal and breath sounds normal. No respiratory distress. She has no wheezes. She has no rales.   Musculoskeletal: She exhibits no edema.  No chest wall tenderness with palpation, right trapezius muscle tight and tender from the neck to shoulder and down to mid back, right shoulder with full range of motion, but bites is slightly harder for her reach straight overhead, no increase in pain with head movements or shoulder movements.  Lymphadenopathy:    She has no cervical adenopathy.  Neurological: A sensory deficit (Slight-right forearm compared to left, no other sensory deficit) is present.  Normal strength bilateral upper extremities  Skin: Skin is warm and dry. She is not diaphoretic.           Assessment & Plan:    See Problem List for Assessment and Plan of chronic medical problems.

## 2018-03-03 NOTE — Assessment & Plan Note (Signed)
Few days of dark urine without dysuria, hematuria or increased frequency She has not been drinking much fluids over the past few days and that is likely the cause Doubt infection, but will check urinalysis, urine culture Stressed the need to increase fluids

## 2018-03-03 NOTE — Patient Instructions (Signed)
  Test(s) ordered today. Your results will be released to MyChart (or called to you) after review, usually within 72hours after test completion. If any changes need to be made, you will be notified at that same time.    Medications reviewed and updated.  Changes include taking omeprazole for your stomach for 2-4 weeks and the muscle relaxer for the next few days.   Your prescription(s) have been submitted to your pharmacy. Please take as directed and contact our office if you believe you are having problem(s) with the medication(s).  Call if no improvement

## 2018-03-04 LAB — URINE CULTURE
MICRO NUMBER:: 91056102
SPECIMEN QUALITY:: ADEQUATE

## 2018-03-05 ENCOUNTER — Other Ambulatory Visit: Payer: Self-pay | Admitting: Internal Medicine

## 2018-03-05 DIAGNOSIS — R41840 Attention and concentration deficit: Secondary | ICD-10-CM

## 2018-03-09 ENCOUNTER — Telehealth: Payer: Self-pay | Admitting: Internal Medicine

## 2018-03-09 NOTE — Telephone Encounter (Signed)
-----   Message from Ottis Stain, CCC-SLP sent at 03/09/2018  9:42 AM EDT ----- FYI:  Your patient is scheduled to see Dr. Jason Fila on 06/11/18. Pt is aware.  Thank you for the referral.

## 2018-03-22 ENCOUNTER — Encounter: Payer: Self-pay | Admitting: Internal Medicine

## 2018-03-25 ENCOUNTER — Other Ambulatory Visit: Payer: Self-pay | Admitting: Internal Medicine

## 2018-04-08 ENCOUNTER — Other Ambulatory Visit: Payer: Self-pay | Admitting: Internal Medicine

## 2018-04-09 MED ORDER — OMEPRAZOLE 20 MG PO CPDR: DELAYED_RELEASE_CAPSULE | ORAL | 1 refills | Status: DC

## 2018-04-09 NOTE — Addendum Note (Signed)
Addended by: Zenovia Jordan B on: 04/09/2018 08:24 AM   Modules accepted: Orders

## 2018-06-11 ENCOUNTER — Ambulatory Visit: Payer: 59 | Admitting: Psychology

## 2018-08-01 ENCOUNTER — Other Ambulatory Visit: Payer: Self-pay

## 2018-08-01 ENCOUNTER — Encounter (HOSPITAL_BASED_OUTPATIENT_CLINIC_OR_DEPARTMENT_OTHER): Payer: Self-pay | Admitting: Emergency Medicine

## 2018-08-01 ENCOUNTER — Emergency Department (HOSPITAL_BASED_OUTPATIENT_CLINIC_OR_DEPARTMENT_OTHER)
Admission: EM | Admit: 2018-08-01 | Discharge: 2018-08-02 | Disposition: A | Discharge status: Home / Self Care | Payer: 59 | Attending: Emergency Medicine | Admitting: Emergency Medicine

## 2018-08-01 DIAGNOSIS — F419 Anxiety disorder, unspecified: Secondary | ICD-10-CM | POA: Diagnosis not present

## 2018-08-01 DIAGNOSIS — Z79899 Other long term (current) drug therapy: Secondary | ICD-10-CM | POA: Diagnosis not present

## 2018-08-01 DIAGNOSIS — R5381 Other malaise: Secondary | ICD-10-CM | POA: Diagnosis present

## 2018-08-01 DIAGNOSIS — F329 Major depressive disorder, single episode, unspecified: Secondary | ICD-10-CM | POA: Insufficient documentation

## 2018-08-01 DIAGNOSIS — R112 Nausea with vomiting, unspecified: Secondary | ICD-10-CM | POA: Insufficient documentation

## 2018-08-01 LAB — CBC WITH DIFFERENTIAL/PLATELET
Abs Immature Granulocytes: 0.02 10*3/uL (ref 0.00–0.07)
Basophils Absolute: 0 10*3/uL (ref 0.0–0.1)
Basophils Relative: 0 %
EOS ABS: 0.1 10*3/uL (ref 0.0–0.5)
Eosinophils Relative: 1 %
HEMATOCRIT: 47.2 % — AB (ref 36.0–46.0)
HEMOGLOBIN: 15.6 g/dL — AB (ref 12.0–15.0)
Immature Granulocytes: 0 %
LYMPHS ABS: 0.8 10*3/uL (ref 0.7–4.0)
LYMPHS PCT: 10 %
MCH: 31.2 pg (ref 26.0–34.0)
MCHC: 33.1 g/dL (ref 30.0–36.0)
MCV: 94.4 fL (ref 80.0–100.0)
MONO ABS: 0.4 10*3/uL (ref 0.1–1.0)
MONOS PCT: 6 %
Neutro Abs: 6.4 10*3/uL (ref 1.7–7.7)
Neutrophils Relative %: 83 %
Platelets: 213 10*3/uL (ref 150–400)
RBC: 5 MIL/uL (ref 3.87–5.11)
RDW: 11.9 % (ref 11.5–15.5)
WBC: 7.8 10*3/uL (ref 4.0–10.5)
nRBC: 0 % (ref 0.0–0.2)

## 2018-08-01 LAB — URINALYSIS, ROUTINE W REFLEX MICROSCOPIC
GLUCOSE, UA: NEGATIVE mg/dL
Hgb urine dipstick: NEGATIVE
KETONES UR: 40 mg/dL — AB
Leukocytes, UA: NEGATIVE
Nitrite: NEGATIVE
PH: 6 (ref 5.0–8.0)
PROTEIN: NEGATIVE mg/dL
Specific Gravity, Urine: 1.025 (ref 1.005–1.030)

## 2018-08-01 LAB — COMPREHENSIVE METABOLIC PANEL
ALK PHOS: 48 U/L (ref 38–126)
ALT: 19 U/L (ref 0–44)
ANION GAP: 9 (ref 5–15)
AST: 24 U/L (ref 15–41)
Albumin: 4.2 g/dL (ref 3.5–5.0)
BILIRUBIN TOTAL: 0.6 mg/dL (ref 0.3–1.2)
BUN: 14 mg/dL (ref 6–20)
CO2: 22 mmol/L (ref 22–32)
CREATININE: 0.78 mg/dL (ref 0.44–1.00)
Calcium: 9.2 mg/dL (ref 8.9–10.3)
Chloride: 102 mmol/L (ref 98–111)
GFR calc non Af Amer: >60 mL/min (ref >60)
GLUCOSE: 98 mg/dL (ref 70–99)
Potassium: 3.9 mmol/L (ref 3.5–5.1)
SODIUM: 133 mmol/L — AB (ref 135–145)
TOTAL PROTEIN: 7.9 g/dL (ref 6.5–8.1)

## 2018-08-01 LAB — PREGNANCY, URINE: Preg Test, Ur: NEGATIVE

## 2018-08-01 LAB — LIPASE, BLOOD: Lipase: 28 U/L (ref 11–51)

## 2018-08-01 MED ORDER — SODIUM CHLORIDE 0.9% FLUSH
3.0000 mL | Freq: Once | INTRAVENOUS | Status: DC
  Filled 2018-08-01: qty 3

## 2018-08-01 MED ORDER — SODIUM CHLORIDE 0.9 % IV BOLUS
1000.0000 mL | Freq: Once | INTRAVENOUS | Status: AC
  Administered 2018-08-01: 1000 mL via INTRAVENOUS

## 2018-08-01 MED ORDER — ONDANSETRON HCL 4 MG/2ML IJ SOLN
4.0000 mg | Freq: Once | INTRAMUSCULAR | Status: AC
  Administered 2018-08-01: 4 mg via INTRAVENOUS
  Filled 2018-08-01: qty 2

## 2018-08-01 MED ORDER — KETOROLAC TROMETHAMINE 15 MG/ML IJ SOLN
15.0000 mg | Freq: Once | INTRAMUSCULAR | Status: AC
  Administered 2018-08-01: 15 mg via INTRAVENOUS
  Filled 2018-08-01: qty 1

## 2018-08-01 NOTE — ED Provider Notes (Signed)
[facility] Provider Note: Lowella Dell, MD, FACEP  CSN: [id number] MRN: [id number] ARRIVAL: 08/01/18 at 2212 ROOM: [facility]   CHIEF COMPLAINT  Abdominal Pain   HISTORY OF PRESENT ILLNESS  08/01/18 11:24 PM Ellen Whitney is a 28 y.o. female with general malaise, body aches and nausea since this morning.  She vomited one time and had a normal bowel movement.  She continues to have intermittent, crampy, moderate abdominal pain which is somewhat diffuse but most prominent in the epigastrium.  Her pain is improved significantly but continues to be nauseated.  She was noted to have a temperature of 99.5 on arrival.  She was not given anything for the fever or body aches.  She denies cough, shortness of breath or nasal congestion.    Past Medical History:  Diagnosis Date  . Allergy   . Anxiety   . Depression   . Eczema     Past Surgical History:  Procedure Laterality Date  . NO PAST SURGERIES      Family History  Problem Relation Age of Onset  . Colon cancer Maternal Grandmother   . Healthy Mother   . Healthy Father   . Thyroid disease Maternal Aunt   . Cancer Paternal Uncle        two great uncles with stomach or colon cancer  . Breast cancer Cousin   . Diabetes Other        runs in family, 2 sisters pre-diabetic    Social History   Tobacco Use  . Smoking status: Never Smoker  . Smokeless tobacco: Never Used  Substance Use Topics  . Alcohol use: No    Alcohol/week: 0.0 standard drinks  . Drug use: No    Prior to Admission medications   Medication Sig Start Date End Date Taking? Authorizing Provider  methocarbamol (ROBAXIN) 500 MG tablet Take 1 tablet (500 mg total) by mouth every 8 (eight) hours as needed for muscle spasms. 03/03/18   Pincus Sanes, MD  omeprazole (PRILOSEC) 20 MG capsule Take one capsule by mouth daily for 2-4 weeks, then stop 04/09/18   Pincus Sanes, MD    Allergies Patient has no known allergies.   REVIEW OF SYSTEMS    Negative except as noted here or in the History of Present Illness.   PHYSICAL EXAMINATION  Initial Vital Signs Blood pressure 109/66, pulse (!) 121, temperature 99.5 F (37.5 C), temperature source Oral, resp. rate 18, height 5\' 3"  (1.6 m), weight 64 kg, last menstrual period 07/06/2018, SpO2 100 %.  Examination General: Well-developed, well-nourished female in no acute distress; appearance consistent with age of record HENT: normocephalic; atraumatic Eyes: pupils equal, round and reactive to light; extraocular muscles intact Neck: supple Heart: regular rate and rhythm; tachycardia Lungs: clear to auscultation bilaterally Abdomen: soft; nondistended; nontender; no masses or hepatosplenomegaly; bowel sounds present Extremities: No deformity; full range of motion; pulses normal Neurologic: Awake, alert and oriented; motor function intact in all extremities and symmetric; no facial droop Skin: Warm and dry Psychiatric: Normal mood and affect   RESULTS  Summary of this visit's results, reviewed by myself:   EKG Interpretation  Date/Time:    Ventricular Rate:    PR Interval:    QRS Duration:   QT Interval:    QTC Calculation:   R Axis:     Text Interpretation:        Laboratory Studies: Results for orders placed or performed during the hospital encounter of 08/01/18 (from the past 24  hour(s))  Urinalysis, Routine w reflex microscopic     Status: Abnormal   Collection Time: 08/01/18 10:34 PM  Result Value Ref Range   Color, Urine YELLOW YELLOW   APPearance CLEAR CLEAR   Specific Gravity, Urine 1.025 1.005 - 1.030   pH 6.0 5.0 - 8.0   Glucose, UA NEGATIVE NEGATIVE mg/dL   Hgb urine dipstick NEGATIVE NEGATIVE   Bilirubin Urine SMALL (A) NEGATIVE   Ketones, ur 40 (A) NEGATIVE mg/dL   Protein, ur NEGATIVE NEGATIVE mg/dL   Nitrite NEGATIVE NEGATIVE   Leukocytes, UA NEGATIVE NEGATIVE  Pregnancy, urine     Status: None   Collection Time: 08/01/18 10:34 PM  Result Value  Ref Range   Preg Test, Ur NEGATIVE NEGATIVE  Lipase, blood     Status: None   Collection Time: 08/01/18 10:48 PM  Result Value Ref Range   Lipase 28 11 - 51 U/L  Comprehensive metabolic panel     Status: Abnormal   Collection Time: 08/01/18 10:48 PM  Result Value Ref Range   Sodium 133 (L) 135 - 145 mmol/L   Potassium 3.9 3.5 - 5.1 mmol/L   Chloride 102 98 - 111 mmol/L   CO2 22 22 - 32 mmol/L   Glucose, Bld 98 70 - 99 mg/dL   BUN 14 6 - 20 mg/dL   Creatinine, Ser 4.400.78 0.44 - 1.00 mg/dL   Calcium 9.2 8.9 - 34.710.3 mg/dL   Total Protein 7.9 6.5 - 8.1 g/dL   Albumin 4.2 3.5 - 5.0 g/dL   AST 24 15 - 41 U/L   ALT 19 0 - 44 U/L   Alkaline Phosphatase 48 38 - 126 U/L   Total Bilirubin 0.6 0.3 - 1.2 mg/dL   GFR calc non Af Amer >60 >60 mL/min   GFR calc Af Amer >60 >60 mL/min   Anion gap 9 5 - 15  CBC with Differential/Platelet     Status: Abnormal   Collection Time: 08/01/18 10:48 PM  Result Value Ref Range   WBC 7.8 4.0 - 10.5 K/uL   RBC 5.00 3.87 - 5.11 MIL/uL   Hemoglobin 15.6 (H) 12.0 - 15.0 g/dL   HCT 42.547.2 (H) 95.636.0 - 38.746.0 %   MCV 94.4 80.0 - 100.0 fL   MCH 31.2 26.0 - 34.0 pg   MCHC 33.1 30.0 - 36.0 g/dL   RDW 56.411.9 33.211.5 - 95.115.5 %   Platelets 213 150 - 400 K/uL   nRBC 0.0 0.0 - 0.2 %   Neutrophils Relative % 83 %   Neutro Abs 6.4 1.7 - 7.7 K/uL   Lymphocytes Relative 10 %   Lymphs Abs 0.8 0.7 - 4.0 K/uL   Monocytes Relative 6 %   Monocytes Absolute 0.4 0.1 - 1.0 K/uL   Eosinophils Relative 1 %   Eosinophils Absolute 0.1 0.0 - 0.5 K/uL   Basophils Relative 0 %   Basophils Absolute 0.0 0.0 - 0.1 K/uL   Immature Granulocytes 0 %   Abs Immature Granulocytes 0.02 0.00 - 0.07 K/uL   Imaging Studies: No results found.  ED COURSE and MDM  Nursing notes and initial vitals signs, including pulse oximetry, reviewed.  Vitals:   08/01/18 2227 08/01/18 2228 08/01/18 2331  BP: 109/66    Pulse: (!) 121    Resp: 18    Temp: 99.5 F (37.5 C)  99.8 F (37.7 C)  TempSrc: Oral   Oral  SpO2: 100%    Weight:  64 kg   Height:  5\' 3"  (1.6 m)    12:29 AM Patient feeling better after IV fluids, Toradol and Zofran.  Tachycardia has resolved.  We will provide Zofran.  She was advised to use over-the-counter Imodium should diarrhea occur.  Presentation consistent with a viral GI infection.  PROCEDURES    ED DIAGNOSES     ICD-10-CM   1. Nausea and vomiting in adult R11.2        Evoleht Hovatter, Jonny Ruiz, MD 08/02/18 0030

## 2018-08-01 NOTE — ED Triage Notes (Signed)
Patient states that she is having mid epigastric pain and N/v since yesterday

## 2018-08-02 MED ORDER — ONDANSETRON 8 MG PO TBDP: 8.0000 mg | ORAL_TABLET | Freq: Three times a day (TID) | ORAL | 0 refills | Status: DC | PRN

## 2018-08-02 NOTE — Progress Notes (Signed)
Subjective:    Patient ID: Ellen Whitney, female    DOB: 05-Sep-1990, 28 y.o.   MRN: [id number]  HPI The patient is here for follow up.  She went to the ED 2/2 for abdominal pain, fatigue, body aches and nausea.  She vomited once.  She denied diarrhea or constipation.  Her pain was intermittent, crampy, moderate in nature and diffuse, but most prominent in the epigastric region.  She denied cough, SOB, nasal congestion.  Her had low grade fever in the ED. Her abdomen was nontender. The remainder of her exam was unremarkable.  UA, cbc, cmp and lipase with WNL. Urine preg was neg.  She received zofran, Toradol and IVF.  She was discharged home with a prescription for zofran, but did not pick it up.  The following day, yesterday, she started having diarrhea.  She was still having abdominal pain.   She was in bed all day.  She was sweating a lot.  Today she feels much better.  She had minimal amount to drink today and is afraid to eat.  She is hungry.      Medications and allergies reviewed with patient and updated if appropriate.  Patient Active Problem List   Diagnosis Date Noted  . GERD (gastroesophageal reflux disease) 03/03/2018  . Dark urine 03/03/2018  . Palpitations 03/03/2018  . Trapezius muscle strain, right, initial encounter 03/03/2018  . Headache 03/03/2018  . Breast cyst, right 03/18/2017  . Epigastric pain 03/18/2017  . Hip flexor tendinitis, right 04/01/2016  . Nonallopathic lesion of lumbosacral region 04/01/2016  . Nonallopathic lesion of sacral region 04/01/2016  . 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 Poor posture 09/17/2015  . Nonallopathic lesion of cervical region 09/17/2015  . Nonallopathic lesion of thoracic region 09/17/2015  . Nonallopathic lesion-rib cage 09/17/2015  . Alopecia 08/27/2015  . Anxiety and depression 03/14/2015  . Seasonal allergies 03/14/2015  . Eczema 03/14/2015    Current Outpatient Medications on File Prior to Visit  Medication Sig  Dispense Refill  . ondansetron (ZOFRAN ODT) 8 MG disintegrating tablet Take 1 tablet (8 mg total) by mouth every 8 (eight) hours as needed for nausea or vomiting. 10 tablet 0   No current facility-administered medications on file prior to visit.     Past Medical History:  Diagnosis Date  . Allergy   . Anxiety   . Depression   . Eczema     Past Surgical History:  Procedure Laterality Date  . NO PAST SURGERIES      Social History   Socioeconomic History  . Marital status: Single    Spouse name: Not on file  . Number of children: 0  . Years of education: 7  . Highest education level: Not on file  Occupational History  . 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 Financial resource strain: Not on file  . Food insecurity:    Worry: Not on file    Inability: Not on file  . Transportation needs:    Medical: Not on file    Non-medical: Not on file  Tobacco Use  . Smoking status: Never Smoker  . Smokeless tobacco: Never Used  Substance and Sexual Activity  . Alcohol use: No    Alcohol/week: 0.0 standard drinks  . Drug use: No  . Sexual activity: Yes    Birth control/protection: None  Lifestyle  . Physical activity:    Days per week: Not on file  Minutes per session: Not on file  . Stress: Not on file  Relationships  . Social connections:    Talks on phone: Not on file    Gets together: Not on file    Attends religious service: Not on file    Active member of club or organization: Not on file    Attends meetings of clubs or organizations: Not on file    Relationship status: Not on file  Other Topics Concern  . Not on file  Social History Narrative   Denies religious beliefs effecting health care.   Denies abuse and feels safe where she lives.          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 Colon cancer Maternal  Grandmother   . Healthy Mother   . Healthy Father   . Thyroid disease Maternal Aunt   . Cancer Paternal Uncle        two great uncles with stomach or colon cancer  . Breast cancer Cousin   . Diabetes Other        runs in family, 2 sisters pre-diabetic    Review of Systems  Constitutional: Positive for diaphoresis. Negative for chills and fever.  HENT: Negative for congestion and sore throat.   Respiratory: Negative for cough, shortness of breath and wheezing.   Gastrointestinal: Positive for abdominal pain, diarrhea and nausea.  Neurological: Positive for light-headedness and headaches (mild).       Objective:   Vitals:   08/03/18 1313  BP: 118/80  Pulse: 92  Resp: 16  Temp: 98.3 F (36.8 C)  SpO2: 98%   BP Readings from Last 3 Encounters:  08/03/18 118/80  08/02/18 114/67  03/03/18 140/80   Wt Readings from Last 3 Encounters:  08/03/18 141 lb (64 kg)  08/01/18 141 lb (64 kg)  03/03/18 142 lb (64.4 kg)   Body mass index is 24.98 kg/m.   Physical Exam    Constitutional: Appears well-developed and well-nourished. No distress.  Cardiovascular: Normal rate, regular rhythm and normal heart sounds.   No murmur heard. No carotid bruit .  No edema Pulmonary/Chest: Effort normal and breath sounds normal. No respiratory distress. No has no wheezes. No rales.  Abdomen: soft, minimal tenderness in epigastric region and lower abdomen which she thinks is from her menses), ND Skin: Skin is warm and dry. Not diaphoretic.  Psychiatric: Normal mood and affect. Behavior is normal.   Lab Results  Component Value Date   WBC 7.8 08/01/2018   HGB 15.6 (H) 08/01/2018   HCT 47.2 (H) 08/01/2018   PLT 213 08/01/2018   GLUCOSE 98 08/01/2018   CHOL 196 08/27/2015   TRIG 123.0 08/27/2015   HDL 50.40 08/27/2015   LDLCALC 121 (H) 08/27/2015   ALT 19 08/01/2018   AST 24 08/01/2018   NA 133 (L) 08/01/2018   K 3.9 08/01/2018   CL 102 08/01/2018   CREATININE 0.78 08/01/2018   BUN  14 08/01/2018   CO2 22 08/01/2018   TSH 1.16 03/03/2018      Assessment & Plan:    See Problem List for Assessment and Plan of chronic medical problems.

## 2018-08-03 ENCOUNTER — Telehealth: Payer: Self-pay | Admitting: Internal Medicine

## 2018-08-03 ENCOUNTER — Encounter: Payer: Self-pay | Admitting: Internal Medicine

## 2018-08-03 ENCOUNTER — Ambulatory Visit (INDEPENDENT_AMBULATORY_CARE_PROVIDER_SITE_OTHER): Payer: 59 | Admitting: Internal Medicine

## 2018-08-03 VITALS — BP 118/80 | HR 92 | Temp 98.3°F | Resp 16 | Ht 63.0 in | Wt 141.0 lb

## 2018-08-03 DIAGNOSIS — A084 Viral intestinal infection, unspecified: Secondary | ICD-10-CM | POA: Diagnosis not present

## 2018-08-03 NOTE — Patient Instructions (Signed)
Continue increased rest and fluids.   Eat a a bland diet.   It may take a few more days to feel 100%.      Viral Gastroenteritis, Adult  Viral gastroenteritis is also known as the stomach flu. This condition is caused by various viruses. These viruses can be passed from person to person very easily (are very contagious). This condition may affect your stomach, small intestine, and large intestine. It can cause sudden watery diarrhea, fever, and vomiting. Diarrhea and vomiting can make you feel weak and cause you to become dehydrated. You may not be able to keep fluids down. Dehydration can make you tired and thirsty, cause you to have a dry mouth, and decrease how often you urinate. Older adults and people with other diseases or a weak immune system are at higher risk for dehydration. It is important to replace the fluids that you lose from diarrhea and vomiting. If you become severely dehydrated, you may need to get fluids through an IV tube. What are the causes? Gastroenteritis is caused by various viruses, including rotavirus and norovirus. Norovirus is the most common cause in adults. You can get sick by eating food, drinking water, or touching a surface contaminated with one of these viruses. You can also get sick from sharing utensils or other personal items with an infected person. What increases the risk? This condition is more likely to develop in people:  Who have a weak defense system (immune system).  Who live with one or more children who are younger than 2 years old.  Who live in a nursing home.  Who go on cruise ships. What are the signs or symptoms? Symptoms of this condition start suddenly 1-2 days after exposure to a virus. Symptoms may last a few days or as long as a week. The most common symptoms are watery diarrhea and vomiting. Other symptoms include:  Fever.  Headache.  Fatigue.  Pain in the abdomen.  Chills.  Weakness.  Nausea.  Muscle aches.  Loss of  appetite. How is this diagnosed? This condition is diagnosed with a medical history and physical exam. You may also have a stool test to check for viruses or other infections. How is this treated? This condition typically goes away on its own. The focus of treatment is to restore lost fluids (rehydration). Your health care provider may recommend that you take an oral rehydration solution (ORS) to replace important salts and minerals (electrolytes) in your body. Severe cases of this condition may require giving fluids through an IV tube. Treatment may also include medicine to help with your symptoms. Follow these instructions at home:  Follow instructions from your health care provider about how to care for yourself at home. Follow these recommendations as told by your health care provider:  Take an ORS. This is a drink that is sold at pharmacies and retail stores.  Drink clear fluids in small amounts as you are able. Clear fluids include water, ice chips, diluted fruit juice, and low-calorie sports drinks.  Eat bland, easy-to-digest foods in small amounts as you are able. These foods include bananas, applesauce, rice, lean meats, toast, and crackers.  Avoid fluids that contain a lot of sugar or caffeine, such as energy drinks, sports drinks, and soda.  Avoid alcohol.  Avoid spicy or fatty foods. General instructions  Drink enough fluid to keep your urine clear or pale yellow.  Wash your hands often. If soap and water are not available, use hand sanitizer.  Make sure  that all people in your household wash their hands well and often.  Take over-the-counter and prescription medicines only as told by your health care provider.  Rest at home while you recover.  Watch your condition for any changes.  Take a warm bath to relieve any burning or pain from frequent diarrhea episodes.  Keep all follow-up visits as told by your health care provider. This is important. Contact a health care  provider if:  You cannot keep fluids down.  Your symptoms get worse.  You have new symptoms.  You feel light-headed or dizzy.  You have muscle cramps. Get help right away if:  You have chest pain.  You feel extremely weak or you faint.  You see blood in your vomit.  Your vomit looks like coffee grounds.  You have bloody or black stools or stools that look like tar.  You have a severe headache, a stiff neck, or both.  You have a rash.  You have severe pain, cramping, or bloating in your abdomen.  You have trouble breathing or you are breathing very quickly.  Your heart is beating very quickly.  Your skin feels cold and clammy.  You feel confused.  You have pain when you urinate.  You have signs of dehydration, such as: ? Dark urine, very little urine, or no urine. ? Cracked lips. ? Dry mouth. ? Sunken eyes. ? Sleepiness. ? Weakness. This information is not intended to replace advice given to you by your health care provider. Make sure you discuss any questions you have with your health care provider. Document Released: 06/16/2005 Document Revised: 01/29/2017 Document Reviewed: 02/20/2015 Elsevier Interactive Patient Education  2019 [employer]

## 2018-08-03 NOTE — Assessment & Plan Note (Signed)
Symptoms consistent with probable viral etiology Feeling better Can fill zofran if needed Increase fluids Continue increased rest Bland diet - increase as tolerated Call if no improvement

## 2018-08-03 NOTE — Telephone Encounter (Signed)
Copied from CRM [id number]. Topic: General - Other >> Aug 03, 2018  2:32 PM Tamela OddiHarris, Reizy Dunlow J wrote: Reason for CRM: Patient called to confirm with the nurse or doctor that she is not contagious and able to go back to work.  Patient stated that her manager had asked and she forgot to ask at her last visit.  Please advise and call patient back at [phone]

## 2018-08-04 NOTE — Telephone Encounter (Signed)
[facility] has informed patient.

## 2018-08-04 NOTE — Telephone Encounter (Signed)
Most likely not contagious at this point.  She can return to work

## 2018-10-11 ENCOUNTER — Telehealth: Payer: 59 | Admitting: Physician Assistant

## 2018-10-11 DIAGNOSIS — K219 Gastro-esophageal reflux disease without esophagitis: Secondary | ICD-10-CM | POA: Diagnosis not present

## 2018-10-11 MED ORDER — OMEPRAZOLE 20 MG PO CPDR: DELAYED_RELEASE_CAPSULE | ORAL | 0 refills | Status: DC

## 2018-10-11 NOTE — Progress Notes (Signed)
We are sorry that you are not feeling well.  Here is how we plan to help!  Based on what you shared with me it looks like you most likely have Gastroesophageal Reflux Disease (GERD) You may also have a cough 2/2 allergies and/or asthma. Be sure to follow-up with your primary care provider to make sure you experience relief of symptoms   Gastroesophageal reflux disease (GERD) happens when acid from your stomach flows up into the esophagus.  When acid comes in contact with the esophagus, the acid causes sorenss (inflammation) in the esophagus.  Over time, GERD may create small holes (ulcers) in the lining of the esophagus.  I have prescribed Omeprazole, a Protein Pump inhibitor, 20 mg daily until you follow up with a provider.  Your symptoms should improve in the next day or two.  You can use antacids as needed until symptoms resolve.  Call us if your heartburn worsens, you have trouble swallowing, weight loss, spitting up blood or recurrent vomiting.  Home Care:  May include lifestyle changes such as weight loss, quitting smoking and alcohol consumption  Avoid foods and drinks that make your symptoms worse, such as:  Caffeine or alcoholic drinks  Chocolate  Peppermint or mint flavorings  Garlic and onions  Spicy foods  Citrus fruits, such as oranges, lemons, or limes  Tomato-based foods such as sauce, chili, salsa and pizza  Fried and fatty foods  Avoid lying down for 3 hours prior to your bedtime or prior to taking a nap  Eat small, frequent meals instead of a large meals  Wear loose-fitting clothing.  Do not wear anything tight around your waist that causes pressure on your stomach.  Raise the head of your bed 6 to 8 inches with wood blocks to help you sleep.  Extra pillows will not help.  Seek Help Right Away If:  You have pain in your arms, neck, jaw, teeth or back  Your pain increases or changes in intensity or duration  You develop nausea, vomiting or sweating  (diaphoresis)  You develop shortness of breath or you faint  Your vomit is green, yellow, black or looks like coffee grounds or blood  Your stool is red, bloody or black  These symptoms could be signs of other problems, such as heart disease, gastric bleeding or esophageal bleeding.  Make sure you :  Understand these instructions.  Will watch your condition.  Will get help right away if you are not doing well or get worse.  Your e-visit answers were reviewed by a board certified advanced clinical practitioner to complete your personal care plan.  Depending on the condition, your plan could have included both over the counter or prescription medications.  If there is a problem please reply  once you have received a response from your provider.  Your safety is important to [place].  If you have drug allergies check your prescription carefully.    You can use MyChart to ask questions about today's visit, request a non-urgent call back, or ask for a work or school excuse for 24 hours related to this e-Visit. If it has been greater than 24 hours you will need to follow up with your provider, or enter a new e-Visit to address those concerns.  You will get an e-mail in the next two days asking about your experience.  I hope that your e-visit has been valuable and will speed your recovery. Thank you for using e-visits.

## 2018-10-21 NOTE — Progress Notes (Signed)
Virtual Visit via Video Note  I connected with Ellen Whitney on 10/21/18 at  3:30 PM EDT by a video enabled telemedicine application and verified that I am speaking with the correct person using two identifiers.   I discussed the limitations of evaluation and management by telemedicine and the availability of in person appointments. The patient expressed understanding and agreed to proceed.  The patient is currently at home and I am in the office.    No referring provider.    History of Present Illness: This is an acute visit for cough, tickling in throat, ? allergies  She has had the symptoms for a while-they are not new.  She states a dry cough and a sensation of a tickle in her throat.  We have discussed this in the past.  She has noticed the cough when she sometimes works out hard or sometimes it just occurs randomly.  She has not been exercising recently.  Sometimes she can cough so much she feels like she may throw up.  She thinks the cough may be worse the past few days and could be related to pollen.  Recently she had an ED visit for GERD and was prescribed omeprazole and she states that has helped with the GERD.  She is unsure if it has helped with the cough or not.  She just started that a week ago and states she has not taken it daily.  She is taking Zyrtec daily for her allergies.   Review of Systems  Constitutional: Negative for chills and fever.  HENT: Negative for congestion, ear pain, sinus pain and sore throat.   Eyes: Negative for discharge and redness.  Respiratory: Positive for cough (tickle in throat). Negative for shortness of breath and wheezing.   Cardiovascular: Negative for chest pain.     Social History   Socioeconomic History  . Marital status: Single    Spouse name: Not on file  . Number of children: 0  . Years of education: 7316  . Highest education level: Not on file  Occupational History  . 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 Financial resource strain: Not on file  . Food insecurity:    Worry: Not on file    Inability: Not on file  . Transportation needs:    Medical: Not on file    Non-medical: Not on file  Tobacco Use  . Smoking status: Never Smoker  . Smokeless tobacco: Never Used  Substance and Sexual Activity  . Alcohol use: No    Alcohol/week: 0.0 standard drinks  . Drug use: No  . Sexual activity: Yes    Birth control/protection: None  Lifestyle  . Physical activity:    Days per week: Not on file    Minutes per session: Not on file  . Stress: Not on file  Relationships  . Social connections:    Talks on phone: Not on file    Gets together: Not on file    Attends religious service: Not on file    Active member of club or organization: Not on file    Attends meetings of clubs or organizations: Not on file    Relationship status: Not on file  Other Topics Concern  . Not on file  Social History Narrative   Denies religious beliefs effecting health care.   Denies abuse and feels safe where she lives.          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   Follow Up Instructions:    I discussed the assessment and treatment plan with the patient. The patient was provided an opportunity to ask questions and all were answered. The patient agreed with the plan and demonstrated an understanding of the instructions.   The patient was advised to call back or seek an in-person evaluation if the symptoms worsen or if the condition fails to improve as anticipated.    Pincus Sanes, MD

## 2018-10-22 ENCOUNTER — Ambulatory Visit (INDEPENDENT_AMBULATORY_CARE_PROVIDER_SITE_OTHER): Payer: 59 | Admitting: Internal Medicine

## 2018-10-22 ENCOUNTER — Encounter: Payer: Self-pay | Admitting: Internal Medicine

## 2018-10-22 DIAGNOSIS — R05 Cough: Secondary | ICD-10-CM

## 2018-10-22 DIAGNOSIS — R059 Cough, unspecified: Secondary | ICD-10-CM

## 2018-10-22 MED ORDER — ALBUTEROL SULFATE HFA 108 (90 BASE) MCG/ACT IN AERS: 2.0000 | INHALATION_SPRAY | Freq: Four times a day (QID) | RESPIRATORY_TRACT | 0 refills | Status: DC | PRN

## 2018-10-22 MED ORDER — FLUTICASONE PROPIONATE 50 MCG/ACT NA SUSP: 2.0000 | Freq: Every day | NASAL | 6 refills | Status: AC

## 2018-10-22 MED ORDER — CETIRIZINE HCL 10 MG PO TABS: 10.0000 mg | ORAL_TABLET | Freq: Every day | ORAL | 11 refills | Status: DC

## 2018-10-22 NOTE — Assessment & Plan Note (Addendum)
Dry cough and somewhat chronic Also associated with a tickle in the throat, but no other symptoms  Could be allergy related, GERD related or asthma variant Continue Zyrtec, add Flonase Continue omeprazole-advised to take daily for now and once controlled can take as needed.  Advised her to work on diet changes Trial of an albuterol inhaler to see if that helps-advised he could be asthma, but it is very important to keep her allergies and GERD well controlled Can consider official testing for asthma after coronavirus situation has resolved  Discussed possible side effects of albuterol inhaler.  Advised her to send me an update through my chart so that we can revise medications if needed

## 2019-04-10 NOTE — Patient Instructions (Addendum)
Tests ordered today. Your results will be released to [place] (or called to you) after review.  If any changes need to be made, you will be notified at that same time.  All other Health Maintenance issues reviewed.   All recommended immunizations and age-appropriate screenings are up-to-date or discussed.  Flu immunization administered today.    Medications reviewed and updated.  Changes include :   Restart omeprazole   Your prescription(s) have been submitted to your pharmacy. Please take as directed and contact our office if you believe you are having problem(s) with the medication(s).   Please followup in 1 year    Health Maintenance, Female Adopting a healthy lifestyle and getting preventive care are important in promoting health and wellness. Ask your health care provider about:  The right schedule for you to have regular tests and exams.  Things you can do on your own to prevent diseases and keep yourself healthy. What should I know about diet, weight, and exercise? Eat a healthy diet   Eat a diet that includes plenty of vegetables, fruits, low-fat dairy products, and lean protein.  Do not eat a lot of foods that are high in solid fats, added sugars, or sodium. Maintain a healthy weight Body mass index (BMI) is used to identify weight problems. It estimates body fat based on height and weight. Your health care provider can help determine your BMI and help you achieve or maintain a healthy weight. Get regular exercise Get regular exercise. This is one of the most important things you can do for your health. Most adults should:  Exercise for at least 150 minutes each week. The exercise should increase your heart rate and make you sweat (moderate-intensity exercise).  Do strengthening exercises at least twice a week. This is in addition to the moderate-intensity exercise.  Spend less time sitting. Even light physical activity can be beneficial. Watch cholesterol and blood  lipids Have your blood tested for lipids and cholesterol at 27 years of age, then have this test every 5 years. Have your cholesterol levels checked more often if:  Your lipid or cholesterol levels are high.  You are older than 27 years of age.  You are at high risk for heart disease. What should I know about cancer screening? Depending on your health history and family history, you may need to have cancer screening at various ages. This may include screening for:  Breast cancer.  Cervical cancer.  Colorectal cancer.  Skin cancer.  Lung cancer. What should I know about heart disease, diabetes, and high blood pressure? Blood pressure and heart disease  High blood pressure causes heart disease and increases the risk of stroke. This is more likely to develop in people who have high blood pressure readings, are of African descent, or are overweight.  Have your blood pressure checked: ? Every 3-5 years if you are 78-93 years of age. ? Every year if you are 87 years old or older. Diabetes Have regular diabetes screenings. This checks your fasting blood sugar level. Have the screening done:  Once every three years after age 21 if you are at a normal weight and have a low risk for diabetes.  More often and at a younger age if you are overweight or have a high risk for diabetes. What should I know about preventing infection? Hepatitis B If you have a higher risk for hepatitis B, you should be screened for this virus. Talk with your health care provider to find out if you  are at risk for hepatitis B infection. Hepatitis C Testing is recommended for:  Everyone born from [birth year] through 1964.  Anyone with known risk factors for hepatitis C. Sexually transmitted infections (STIs)  Get screened for STIs, including gonorrhea and chlamydia, if: ? You are sexually active and are younger than 27 years of age. ? You are older than 27 years of age and your health care provider tells you that  you are at risk for this type of infection. ? Your sexual activity has changed since you were last screened, and you are at increased risk for chlamydia or gonorrhea. Ask your health care provider if you are at risk.  Ask your health care provider about whether you are at high risk for HIV. Your health care provider may recommend a prescription medicine to help prevent HIV infection. If you choose to take medicine to prevent HIV, you should first get tested for HIV. You should then be tested every 3 months for as long as you are taking the medicine. Pregnancy  If you are about to stop having your period (premenopausal) and you may become pregnant, seek counseling before you get pregnant.  Take 400 to 800 micrograms (mcg) of folic acid every day if you become pregnant.  Ask for birth control (contraception) if you want to prevent pregnancy. Osteoporosis and menopause Osteoporosis is a disease in which the bones lose minerals and strength with aging. This can result in bone fractures. If you are 38 years old or older, or if you are at risk for osteoporosis and fractures, ask your health care provider if you should:  Be screened for bone loss.  Take a calcium or vitamin D supplement to lower your risk of fractures.  Be given hormone replacement therapy (HRT) to treat symptoms of menopause. Follow these instructions at home: Lifestyle  Do not use any products that contain nicotine or tobacco, such as cigarettes, e-cigarettes, and chewing tobacco. If you need help quitting, ask your health care provider.  Do not use street drugs.  Do not share needles.  Ask your health care provider for help if you need support or information about quitting drugs. Alcohol use  Do not drink alcohol if: ? Your health care provider tells you not to drink. ? You are pregnant, may be pregnant, or are planning to become pregnant.  If you drink alcohol: ? Limit how much you use to 0-1 drink a day. ? Limit  intake if you are breastfeeding.  Be aware of how much alcohol is in your drink. In the [place] one drink equals one 12 oz bottle of beer (355 mL), one 5 oz glass of wine (148 mL), or one 1 oz glass of hard liquor (44 mL). General instructions  Schedule regular health, dental, and eye exams.  Stay current with your vaccines.  Tell your health care provider if: ? You often feel depressed. ? You have ever been abused or do not feel safe at home. Summary  Adopting a healthy lifestyle and getting preventive care are important in promoting health and wellness.  Follow your health care provider's instructions about healthy diet, exercising, and getting tested or screened for diseases.  Follow your health care provider's instructions on monitoring your cholesterol and blood pressure. This information is not intended to replace advice given to you by your health care provider. Make sure you discuss any questions you have with your health care provider. Document Released: 12/30/2010 Document Revised: 06/09/2018 Document Reviewed: 06/09/2018 Elsevier Patient Education  2020 Elsevier Inc.  

## 2019-04-10 NOTE — Progress Notes (Signed)
Subjective:    Patient ID: Ellen Whitney, female    DOB: 09-24-1990, 28 y.o.   MRN: [id number]  HPI She is here for a physical exam.   She is working from home.  She feels a little depressed.  She is not exercising.  She has gained weight.  She is not eating like she should be.  She has not been as busy as when she was working and the whole change in lifestyle is not ideal for her.  Right shoulder pain:  It started today.  She has pain with pushing her arm back.   She denies any injury.  She did put something topical on the shoulder.  Medications and allergies reviewed with patient and updated if appropriate.  Patient Active Problem List   Diagnosis Date Noted  . Cough 10/22/2018  . GERD (gastroesophageal reflux disease) 03/03/2018  . Palpitations 03/03/2018  . Trapezius muscle strain, right, initial encounter 03/03/2018  . Headache 03/03/2018  . Breast cyst, right 03/18/2017  . Epigastric pain 03/18/2017  . Hip flexor tendinitis, right 04/01/2016  . Nonallopathic lesion of lumbosacral region 04/01/2016  . Nonallopathic lesion of sacral region 04/01/2016  . 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 Poor posture 09/17/2015  . Nonallopathic lesion of cervical region 09/17/2015  . Nonallopathic lesion of thoracic region 09/17/2015  . Nonallopathic lesion-rib cage 09/17/2015  . Alopecia 08/27/2015  . Anxiety and depression 03/14/2015  . Seasonal allergies 03/14/2015  . Eczema 03/14/2015    Current Outpatient Medications on File Prior to Visit  Medication Sig Dispense Refill  . fluticasone (FLONASE) 50 MCG/ACT nasal spray Place 2 sprays into both nostrils daily. 16 g 6   No current facility-administered medications on file prior to visit.     Past Medical History:  Diagnosis Date  . Allergy   . Anxiety   . Depression   . Eczema     Past Surgical History:  Procedure Laterality Date  . NO PAST SURGERIES      Social History   Socioeconomic History  . Marital status:  Single    Spouse name: Not on file  . Number of children: 0  . Years of education: 8016  . Highest education level: Not on file  Occupational History  . 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 Financial resource strain: Not on file  . Food insecurity    Worry: Not on file    Inability: Not on file  . Transportation needs    Medical: Not on file    Non-medical: Not on file  Tobacco Use  . Smoking status: Never Smoker  . Smokeless tobacco: Never Used  Substance and Sexual Activity  . Alcohol use: No    Alcohol/week: 0.0 standard drinks  . Drug use: No  . Sexual activity: Yes    Birth control/protection: None  Lifestyle  . Physical activity    Days per week: Not on file    Minutes per session: Not on file  . Stress: Not on file  Relationships  . Social Musicianconnections    Talks on phone: Not on file    Gets together: Not on file    Attends religious service: Not on file    Active member of club or organization: Not on file    Attends meetings of clubs or organizations: Not on file    Relationship status: Not on file  Other Topics Concern  . Not on file  Social  History Narrative   Denies religious beliefs effecting health care.   Denies abuse and feels safe where she lives.          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 Colon cancer Maternal Grandmother   . Healthy Mother   . Healthy Father   . Thyroid disease Maternal Aunt   . Cancer Paternal Uncle        two great uncles with stomach or colon cancer  . Breast cancer Cousin   . Diabetes Other        runs in family, 2 sisters pre-diabetic    Review of Systems  Constitutional: Negative for chills and fever.  Eyes: Negative for visual disturbance.  Respiratory: Positive for cough (allergy related). Negative for shortness of breath and wheezing.   Cardiovascular: Negative  for chest pain and palpitations.  Gastrointestinal: Positive for nausea. Negative for abdominal pain, blood in stool, constipation and diarrhea.       GERD  Genitourinary: Negative for dysuria and hematuria.  Musculoskeletal: Positive for arthralgias (right shoulder x today).  Skin: Positive for rash (eczema). Negative for color change.  Neurological: Positive for headaches. Negative for dizziness and light-headedness.  Psychiatric/Behavioral: Positive for dysphoric mood (mild  - does not watn medication). The patient is nervous/anxious (mild).        Objective:   Vitals:   04/11/19 1351  BP: 122/70  Pulse: 93  Resp: 16  Temp: 99.1 F (37.3 C)  SpO2: 98%   Filed Weights   04/11/19 1351  Weight: 148 lb (67.1 kg)   Body mass index is 26.22 kg/m.  BP Readings from Last 3 Encounters:  04/11/19 122/70  08/03/18 118/80  08/02/18 114/67    Wt Readings from Last 3 Encounters:  04/11/19 148 lb (67.1 kg)  08/03/18 141 lb (64 kg)  08/01/18 141 lb (64 kg)     Physical Exam Constitutional: She appears well-developed and well-nourished. No distress.  HENT:  Head: Normocephalic and atraumatic.  Right Ear: External ear normal. Normal ear canal and TM Left Ear: External ear normal.  Normal ear canal and TM Mouth/Throat: Oropharynx is clear and moist.  Eyes: Conjunctivae and EOM are normal.  Neck: Neck supple. No tracheal deviation present. No thyromegaly present.  No carotid bruit  Cardiovascular: Normal rate, regular rhythm and normal heart sounds.   No murmur heard.  No edema. Pulmonary/Chest: Effort normal and breath sounds normal. No respiratory distress. She has no wheezes. She has no rales.  Breast: deferred   Abdominal: Soft. She exhibits no distension. There is no tenderness.  Lymphadenopathy: She has no cervical adenopathy.  Skin: Skin is warm and dry. She is not diaphoretic.  Psychiatric: She has a normal mood and affect. Her behavior is normal.         Assessment & Plan:   Physical exam: Screening blood work    ordered Immunizations  Flu vaccine today, tdap up to date Gyn  Up to date, will get report Exercise  none Weight  BMI is slightly overweight.  Discussed weight loss at length.  Discussed changes in her whole routine including regular exercise and her eating habits.  She did inquire about weight loss medication, which I do not think she needs. Substance abuse  none  See Problem List for Assessment and Plan of chronic medical problems.  Right shoulder pain: She will  let me know if her right shoulder pain persists or worsens and we can refer her to sports medicine, but this started today and does not seem to be an big issue.  Symptomatic treatment  FU in one year, sooner if needed

## 2019-04-11 ENCOUNTER — Ambulatory Visit (INDEPENDENT_AMBULATORY_CARE_PROVIDER_SITE_OTHER): Payer: 59 | Admitting: Internal Medicine

## 2019-04-11 ENCOUNTER — Other Ambulatory Visit: Payer: Self-pay

## 2019-04-11 ENCOUNTER — Other Ambulatory Visit (INDEPENDENT_AMBULATORY_CARE_PROVIDER_SITE_OTHER): Payer: 59

## 2019-04-11 ENCOUNTER — Encounter: Payer: Self-pay | Admitting: Internal Medicine

## 2019-04-11 VITALS — BP 122/70 | HR 93 | Temp 99.1°F | Resp 16 | Ht 63.0 in | Wt 148.0 lb

## 2019-04-11 DIAGNOSIS — K219 Gastro-esophageal reflux disease without esophagitis: Secondary | ICD-10-CM | POA: Diagnosis not present

## 2019-04-11 DIAGNOSIS — F329 Major depressive disorder, single episode, unspecified: Secondary | ICD-10-CM

## 2019-04-11 DIAGNOSIS — Z23 Encounter for immunization: Secondary | ICD-10-CM

## 2019-04-11 DIAGNOSIS — F32A Depression, unspecified: Secondary | ICD-10-CM

## 2019-04-11 DIAGNOSIS — F419 Anxiety disorder, unspecified: Secondary | ICD-10-CM

## 2019-04-11 DIAGNOSIS — Z0000 Encounter for general adult medical examination without abnormal findings: Secondary | ICD-10-CM

## 2019-04-11 DIAGNOSIS — J302 Other seasonal allergic rhinitis: Secondary | ICD-10-CM

## 2019-04-11 LAB — LIPID PANEL
Cholesterol: 212 mg/dL — ABNORMAL HIGH (ref 0–200)
HDL: 52.9 mg/dL (ref >39.00)
LDL Cholesterol: 137 mg/dL — ABNORMAL HIGH (ref 0–99)
NonHDL: 159.4
Total CHOL/HDL Ratio: 4
Triglycerides: 110 mg/dL (ref 0.0–149.0)
VLDL: 22 mg/dL (ref 0.0–40.0)

## 2019-04-11 LAB — CBC WITH DIFFERENTIAL/PLATELET
Basophils Absolute: 0.1 10*3/uL (ref 0.0–0.1)
Basophils Relative: 0.6 % (ref 0.0–3.0)
Eosinophils Absolute: 0.4 10*3/uL (ref 0.0–0.7)
Eosinophils Relative: 3.7 % (ref 0.0–5.0)
HCT: 44 % (ref 36.0–46.0)
Hemoglobin: 14.6 g/dL (ref 12.0–15.0)
Lymphocytes Relative: 39.2 % (ref 12.0–46.0)
Lymphs Abs: 3.8 10*3/uL (ref 0.7–4.0)
MCHC: 33.2 g/dL (ref 30.0–36.0)
MCV: 93 fl (ref 78.0–100.0)
Monocytes Absolute: 0.7 10*3/uL (ref 0.1–1.0)
Monocytes Relative: 7 % (ref 3.0–12.0)
Neutro Abs: 4.8 10*3/uL (ref 1.4–7.7)
Neutrophils Relative %: 49.5 % (ref 43.0–77.0)
Platelets: 282 10*3/uL (ref 150.0–400.0)
RBC: 4.73 Mil/uL (ref 3.87–5.11)
RDW: 12.4 % (ref 11.5–15.5)
WBC: 9.6 10*3/uL (ref 4.0–10.5)

## 2019-04-11 LAB — COMPREHENSIVE METABOLIC PANEL
ALT: 13 U/L (ref 0–35)
AST: 15 U/L (ref 0–37)
Albumin: 4.9 g/dL (ref 3.5–5.2)
Alkaline Phosphatase: 58 U/L (ref 39–117)
BUN: 12 mg/dL (ref 6–23)
CO2: 26 mEq/L (ref 19–32)
Calcium: 10.8 mg/dL — ABNORMAL HIGH (ref 8.4–10.5)
Chloride: 101 mEq/L (ref 96–112)
Creatinine, Ser: 0.76 mg/dL (ref 0.40–1.20)
GFR: 90.92 mL/min (ref >60.00)
Glucose, Bld: 90 mg/dL (ref 70–99)
Potassium: 5.4 mEq/L — ABNORMAL HIGH (ref 3.5–5.1)
Sodium: 138 mEq/L (ref 135–145)
Total Bilirubin: 0.5 mg/dL (ref 0.2–1.2)
Total Protein: 8.8 g/dL — ABNORMAL HIGH (ref 6.0–8.3)

## 2019-04-11 LAB — TSH: TSH: 0.92 u[IU]/mL (ref 0.35–4.50)

## 2019-04-11 MED ORDER — OMEPRAZOLE 20 MG PO CPDR: DELAYED_RELEASE_CAPSULE | ORAL | 11 refills | Status: DC

## 2019-04-11 NOTE — Assessment & Plan Note (Signed)
She is experiencing some mild anxiety and depression, but she is not interested in medication and wants to work on lifestyle changes which will likely help She will let me know if she needs further help with this

## 2019-04-11 NOTE — Assessment & Plan Note (Addendum)
Having increased GERD - not taking medication Discussed diet changes, weight loss and the need to take medication intermittently as needed to avoid damage to her esophagus Refilled omeprazole 20 mg-she will take 2-4 weeks as needed to get her heartburn controlled.  Discussed that ultimately she does not want to be on this on a chronic basis, but if she needs that she needs to take it

## 2019-04-12 ENCOUNTER — Encounter: Payer: Self-pay | Admitting: Internal Medicine

## 2019-05-18 ENCOUNTER — Telehealth: Payer: Self-pay | Admitting: Internal Medicine

## 2019-05-18 MED ORDER — FLUCONAZOLE 150 MG PO TABS: 150.0000 mg | ORAL_TABLET | Freq: Once | ORAL | 0 refills | Status: AC

## 2019-05-18 NOTE — Telephone Encounter (Signed)
Pt wants to come pick it up or you can email it to [email or web address]/ Please call Pt to let her know if you emailed it

## 2019-05-18 NOTE — Telephone Encounter (Signed)
Sent to [facility] 

## 2019-05-18 NOTE — Telephone Encounter (Signed)
Pt would like to see if she can have something sent in for yeast infection. She has vaginal itching and discharge.

## 2019-05-18 NOTE — Telephone Encounter (Signed)
Pt called about a refill for a medication for yeast infection. Was told that it would be available if she needed it but she doesn't remember the name of the med/ please advise

## 2019-05-18 NOTE — Telephone Encounter (Signed)
Pt aware.

## 2019-05-18 NOTE — Telephone Encounter (Signed)
Form faxed. Pt aware.  

## 2019-08-16 ENCOUNTER — Ambulatory Visit (INDEPENDENT_AMBULATORY_CARE_PROVIDER_SITE_OTHER): Payer: 59 | Admitting: Internal Medicine

## 2019-08-16 ENCOUNTER — Other Ambulatory Visit: Payer: Self-pay

## 2019-08-16 ENCOUNTER — Encounter: Payer: Self-pay | Admitting: Internal Medicine

## 2019-08-16 VITALS — BP 112/70 | HR 87 | Temp 97.9°F | Ht 63.0 in | Wt 149.8 lb

## 2019-08-16 DIAGNOSIS — R0602 Shortness of breath: Secondary | ICD-10-CM | POA: Diagnosis not present

## 2019-08-16 DIAGNOSIS — J4520 Mild intermittent asthma, uncomplicated: Secondary | ICD-10-CM | POA: Diagnosis not present

## 2019-08-16 MED ORDER — ALBUTEROL SULFATE HFA 108 (90 BASE) MCG/ACT IN AERS: 2.0000 | INHALATION_SPRAY | Freq: Four times a day (QID) | RESPIRATORY_TRACT | 3 refills | Status: AC | PRN

## 2019-08-16 NOTE — Patient Instructions (Addendum)
The patient should have follow up scheduled with myself in 3 months.   Prior to next visit patient should have PFTs including: Spirometry with bronchodilator if obstructed FeNO  Flonase - 1 spray on each side of your nose twice a day for first week, then 1 spray on each side.   Instructions for use:  If you also use a saline nasal spray or rinse, use that first.  Position the head with the chin slightly tucked. Use the right hand to spray into the left nostril and the right hand to spray into the left nostril.   Point the bottle away from the septum of your nose (cartilage that divides the two sides of your nose).   Hold the nostril closed on the opposite side from where you will spray  Spray once and gently sniff to pull the medicine into the higher parts of your nose.  Don't sniff too hard as the medicine will drain down the back of your throat instead.  Repeat with a second spray on the same side if prescribed.  Repeat on the other side of your nose.  ------------------------------------------------------------------------------------------------------------------- Metered Dose Inhaler (MDI) Instructions (Albuterol, Advair, Symbicort, Dulera, Flovent)  Before using your inhaler for the first time: 1. Take the cap off the mouthpiece. 2. Shake the inhaler for 5 seconds 3. Press down on the canister to spray the medicine into the air. 4. Repeat these steps 3 more times. If you haven't used your inhaler in more than 2 weeks, repeat these steps before using it.  To use your inhaler: 1. Take the cap off the mouthpiece 2. Shake the inhaler for 5 seconds. 3. Hold it upright with your finger on the top of the canister and your thumb on the bottom of the inhaler. 4. Breathe out. 5. Close your lips around the mouthpiece. 6. As you start to inhale the next breath, press down on the canister. 7. Inhale deeply and slowly through your mouth. 8. Hold your breath for 5 to 10 seconds to keep  the medicine in your lungs. 9. Let your breath out.   10. Repeat these steps if you are supposed to take 2 puffs.  11. Put the cap back on the mouthpiece 12. Remember to rinse, gargle and spit with water after use if your inhaler has a steroid in it (Advair, Symbicort, Dulera, Qvar, Flovent)  Caring for your MDI and chamber For most MDIs, remove the canister and rinse the plastic holder with warm running water once a week to prevent the holes from getting clogged. Shake well and let air dry. There are some medications in which the inhaler cannot be removed from the holder. These usually need to be cleaned by wiping the mouthpiece with a cloth or cleaning with a dry cotton swab. Refer to the patient instructions that come with your inhaler. Clean the chamber about once a week. Remove the soft ring at the end of the chamber. Soak the spacer in warm water with a mild detergent. Carefully clean and, rinse, and shake off excess water. Do not hand dry. Allow to completely air dry. Do not store the chamber in a plastic bag.  Checking your MDI It is important that you know how much medication is left in your inhaler. The number of puffs contained in your MDI is printed on the side of the canister. After you have used that number of puffs, you must discard your inhaler even if it continues to spray. Keep track of how many puffs  you have used. You also must include priming puffs in this total. If you use an MDI every day for control of symptoms, you can determine how long it will last by dividing the total number of puffs in the MDI by the total puffs you use every day. For example: 2 puffs x 2 times per day = 4 total puffs per day. At 120 puffs, the MDI will last 30 days. If you use an inhaler only when you need to, you must keep track of how many times you spray the inhaler. Some of the newer MDIs have counting devices built in.  If your MDI does not have a dose counter, you can obtain a device that attaches  to the MDI and counts down the number of puffs each time you press the inhaler. Ask your health care professional for more information about these devices, as well as how to best keep track of your medicine without an add-on device (if you prefer).   By learning about asthma and how it can be controlled, you take an important step toward managing this disease. Work closely with your asthma care team to learn all you can about your asthma, how to avoid triggers, what your medications do, and how to take them correctly. With proper care, you can live free of asthma symptoms and maintain a normal, healthy lifestyle.   What is asthma? Asthma is a chronic disease that affects the airways of the lungs. During normal breathing, the bands of muscle that surround the airways are relaxed and air moves freely. During an asthma episode or "attack," there are three main changes that stop air from moving easily through the airways:  The bands of muscle that surround the airways tighten and make the airways narrow. This tightening is called bronchospasm.   The lining of the airways becomes swollen or inflamed.   The cells that line the airways produce more mucus, which is thicker than normal and clogs the airways.  These three factors - bronchospasm, inflammation, and mucus production - cause symptoms such as difficulty breathing, wheezing, and coughing.  What are the most common symptoms of asthma? Asthma symptoms are not the same for everyone. They can even change from episode to episode in the same person. Also, you may have only one symptom of asthma, such as cough, but another person may have all the symptoms of asthma. It is important to know all the symptoms of asthma and to be aware that your asthma can present in any of these ways at any time. The most common symptoms include: . Coughing, especially at night  . Shortness of breath  . Wheezing  . Chest tightness, pain, or pressure   Who is affected  by asthma? Asthma affects 22 million Americans; about 6 million of these are children under age 20. People who have a family history of asthma have an increased risk of developing the disease. Asthma is also more common in people who have allergies or who are exposed to tobacco smoke. However, anyone can develop asthma at any time. Some people may have asthma all of their lives, while others may develop it as adults.  What causes asthma? The airways in a person with asthma are very sensitive and react to many things, or "triggers." Contact with these triggers causes asthma symptoms. One of the most important parts of asthma control is to identify your triggers and then avoid them when possible. The only trigger you do not want to avoid is  exercise. Pre-treatment with medicines before exercise can allow you to stay active yet avoid asthma symptoms. Common asthma triggers include: 1. Infections (colds, viruses, flu, sinus infections)  2. Exercise  3. Weather (changes in temperature and/or humidity, cold air)  4. Tobacco smoke  5. Allergens (dust mites, pollens, pets, mold spores, cockroaches, and sometimes foods)  6. Irritants (strong odors from cleaning products, perfume, wood smoke, air pollution)  7. Strong emotions such as crying or laughing hard  8. Some medications   How is asthma diagnosed? To diagnose asthma, your doctor will first review your medical history, family history, and symptoms. Your doctor will want to know any past history of breathing problems you may have had, as well as a family history of asthma, allergies, eczema (a bumpy, itchy skin rash caused by allergies), or other lung disease. It is important that you describe your symptoms in detail (cough, wheeze, shortness of breath, chest tightness), including when and how often they occur. The doctor will perform a physical examination and listen to your heart and lungs. He or she may also order breathing tests, allergy tests,  blood tests, and chest and sinus X-rays. The tests will find out if you do have asthma and if there are any other conditions that are contributing factors.  How is asthma treated? Asthma can be controlled, but not cured. It is not normal to have frequent symptoms, trouble sleeping, or trouble completing tasks. Appropriate asthma care will prevent symptoms and visits to the emergency room and hospital. Asthma medicines are one of the mainstays of asthma treatment. The drugs used to treat asthma are explained below.  Anti-inflammatories: These are the most important drugs for most people with asthma. Anti-inflammatory drugs reduce swelling and mucus production in the airways. As a result, airways are less sensitive and less likely to react to triggers. These medications need to be taken daily and may need to be taken for several weeks before they begin to control asthma. Anti-inflammatory medicines lead to fewer symptoms, better airflow, less sensitive airways, less airway damage, and fewer asthma attacks. If taken every day, they CONTROL or prevent asthma symptoms.   Bronchodilators: These drugs relax the muscle bands that tighten around the airways. This action opens the airways, letting more air in and out of the lungs and improving breathing. Bronchodilators also help clear mucus from the lungs. As the airways open, the mucus moves more freely and can be coughed out more easily. In short-acting forms, bronchodilators RELIEVE or stop asthma symptoms by quickly opening the airways and are very helpful during an asthma episode. In long-acting forms, bronchodilators provide CONTROL of asthma symptoms and prevent asthma episodes.  Asthma drugs can be taken in a variety of ways. Inhaling the medications by using a metered dose inhaler, dry powder inhaler, or nebulizer is one way of taking asthma medicines. Oral medicines (pills or liquids you swallow) may also be prescribed.  Asthma severity Asthma is  classified as either "intermittent" (comes and goes) or "persistent" (lasting). Persistent asthma is further described as being mild, moderate, or severe. The severity of asthma is based on how often you have symptoms both during the day and night, as well as by the results of lung function tests and by how well you can perform activities. The "severity" of asthma refers to how "intense" or "strong" your asthma is.  Asthma control Asthma control is the goal of asthma treatment. Regardless of your asthma severity, it may or may not be controlled. Asthma control  means: Marland Kitchen You are able to do everything you want to do at work and home  . You have no (or minimal) asthma symptoms  . You do not wake up from your sleep or earlier than usual in the morning due to asthma  . You rarely need to use your reliever medicine (inhaler)  Another major part of your treatment is that you are happy with your asthma care and believe your asthma is controlled.  Monitoring symptoms A key part of treatment is keeping track of how well your lungs are working. Monitoring your symptoms  what they are, how and when they happen, and how severe they are  is an important part of being able to control your asthma.  Sometimes asthma is monitored using a peak flow meter. A peak flow (PF) meter measures how fast the air comes out of your lungs. It can help you know when your asthma is getting worse, sometimes even before you have symptoms. By taking daily peak flow readings, you can learn when to adjust medications to keep asthma under good control. It is also used to create your asthma action plan (see below). Your doctor can use your peak flow readings to adjust your treatment plan in some cases.  Asthma Action Plan Based on your history and asthma severity, you and your doctor will develop a care plan called an "asthma action plan." The asthma action plan describes when and how to use your medicines, actions to take when asthma  worsens, and when to seek emergency care. Make sure you understand this plan. If you do not, ask your asthma care provider any questions you may have. Your asthma action plan is one of the keys to controlling asthma. Keep it readily available to remind you of what you need to do every day to control asthma and what you need to do when symptoms occur.  Goals of asthma therapy These are the goals of asthma treatment: . Live an active, normal life  . Prevent chronic and troublesome symptoms  . Attend work or school every day  . Perform daily activities without difficulty  . Stop urgent visits to the doctor, emergency department, or hospital  . Use and adjust medications to control asthma with few or no side effects

## 2019-08-16 NOTE — Progress Notes (Signed)
Ellen Whitney    [id number]    1991/01/19  Primary Care Physician:Burns, Claudina Lick, MD Date of Appointment: 08/16/2019 New Patient Evaluation, Self Referred  Chief complaint:   Chief Complaint  Patient presents with  . Consult    sharp pain from sternum to shoulder, SOB     HPI:  2 weeks ago had episode of chest tightness and pain while barbequeing. Wonders if it was triggered by smoke. These symptoms last for minutes and resolve. She was given an inhaler and uses it less than once/week.  These usually improve her symptoms.  With these episodes she occasionally has sharp stabbing pain from her sternum to her shoulder that resolved spontaneously.  She gets a "respiratory infection" every year, usually around January.   Never been hospitalized for her breathing No breathing problems as a little kid. She does get dyspnea and coughing with exercise.   No rhinitis. No fevers, chills, night sweats.   She is taking OTC cetirizine at home.  She does have itching in her throat and postnasal drip.  She has tried taking Flonase occasionally.   Social History: Occupation: she is a Sales promotion account executive.  Exposures: Lives at home with her mom and sister.  Smoking history: dad smoked at home. She is a never smoker, no vaping.   Social History   Occupational History  . 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 Smoking status: Never Smoker  . Smokeless tobacco: Never Used  Substance and Sexual Activity  . Alcohol use: No    Alcohol/week: 0.0 standard drinks  . Drug use: No  . Sexual activity: Yes    Birth control/protection: None    Relevant family history:  Family History  Problem Relation Age of Onset  . Colon cancer Maternal Grandmother   . Healthy Mother   . Healthy Father   . Thyroid disease Maternal Aunt   . Cancer Paternal Uncle        two great uncles with stomach or colon cancer  . Breast cancer Cousin   . Diabetes  Other        runs in family, 2 sisters pre-diabetic    Past Medical History:  Diagnosis Date  . Allergy   . Anxiety   . Depression   . Eczema     Past Surgical History:  Procedure Laterality Date  . NO PAST SURGERIES      Review of systems: Review of Systems  Constitutional: Negative for chills, fever and weight loss.  HENT: Negative for congestion, sinus pain and sore throat.   Eyes: Negative for discharge and redness.  Respiratory: Positive for shortness of breath and wheezing. Negative for cough, hemoptysis and sputum production.   Cardiovascular: Positive for chest pain. Negative for palpitations and leg swelling.  Gastrointestinal: Negative for heartburn, nausea and vomiting.  Musculoskeletal: Negative for joint pain and myalgias.  Skin: Negative for rash.  Neurological: Negative for dizziness, tremors, focal weakness and headaches.  Endo/Heme/Allergies: Negative for environmental allergies.  Psychiatric/Behavioral: Negative for depression. The patient is not nervous/anxious.   All other systems reviewed and are negative.   Physical Exam: Blood pressure 112/70, pulse 87, temperature 97.9 F (36.6 C), temperature source Temporal, height 5\' 3"  (1.6 m), weight 149 lb 12.8 oz (67.9 kg), SpO2 96 %. Gen:      No acute distress Eyes: EOMI, sclera anicteric ENT:  no nasal polyps, mucus membranes moist, cobblestoning in  the oropharynx, no tonsillar adenopathy Neck:     Supple, no thyromegaly Lungs:    No increased respiratory effort, symmetric chest wall excursion, clear to auscultation bilaterally, no wheezes or crackles CV:         Regular rate and rhythm; no murmurs, rubs, or gallops.  No pedal edema Skin:      Warm and dry; no rashes Neuro: normal speech, no focal facial asymmetry Psych: alert and oriented x3, normal mood and affect  Data Reviewed/Medical Decision Making:  No chest imaging on file.   Labs: October 2020 chemistry demonstrates normal renal function,  normal hemoglobin at 14.6.  Immunization status:  Immunization History  Administered Date(s) Administered  . Influenza,inj,Quad PF,6+ Mos 03/14/2015, 03/18/2017, 04/11/2019  . Tdap 03/14/2015    . I reviewed prior external note(s) from Dr. Lawerance Bach renal medicine, Dr. Agustin Cree family medicine . I reviewed the result(s) of the labs and imaging as noted above.  . I have ordered pulmonary function testing  Assessment:  Mild intermittent asthma Allergic rhinitis GERD  Plan/Recommendations:  Proceed with pulmonary function testing needs a spirometry and FeNO.  I recommend she take albuterol as needed, which I prescribed to her pharmacy.  Have asked her to keep a symptom diary as well.  For her rhinitis she can continue taking cetirizine.  Have also asked her to start taking Flonase and a modified her technique and administration today.  I have communicated these findings with Dr. Lawerance Bach, her primary care doctor for ongoing management.  Return in about 3 months (around 11/13/2019).   Durel Salts, MD Pulmonary and Critical Care Medicine [facility] [place] Office:[phone]

## 2019-08-16 NOTE — Progress Notes (Signed)
   Subjective:    Patient ID: Ellen Whitney, female    DOB: Nov 10, 1990, 29 y.o.   MRN: [id number]  HPI    Review of Systems  Constitutional: Negative for fever and unexpected weight change.  HENT: Negative for congestion, dental problem, ear pain, nosebleeds, postnasal drip, rhinorrhea, sinus pressure, sneezing, sore throat and trouble swallowing.   Eyes: Negative for redness and itching.  Respiratory: Positive for cough, chest tightness, shortness of breath and wheezing.   Cardiovascular: Negative for palpitations and leg swelling.  Gastrointestinal: Negative for nausea and vomiting.  Genitourinary: Negative for dysuria.  Musculoskeletal: Negative for joint swelling.  Skin: Negative for rash.  Allergic/Immunologic: Positive for environmental allergies and food allergies. Negative for immunocompromised state.  Neurological: Negative for headaches.  Hematological: Does not bruise/bleed easily.  Psychiatric/Behavioral: The patient is not nervous/anxious.        Objective:   Physical Exam        Assessment & Plan:

## 2019-11-11 ENCOUNTER — Other Ambulatory Visit (HOSPITAL_COMMUNITY)
Admission: RE | Admit: 2019-11-11 | Discharge: 2019-11-11 | Disposition: A | Discharge status: Home / Self Care | Payer: 59 | Source: Ambulatory Visit | Attending: Primary Care | Admitting: Primary Care

## 2019-11-11 DIAGNOSIS — Z01812 Encounter for preprocedural laboratory examination: Secondary | ICD-10-CM | POA: Insufficient documentation

## 2019-11-11 DIAGNOSIS — Z20822 Contact with and (suspected) exposure to covid-19: Secondary | ICD-10-CM | POA: Insufficient documentation

## 2019-11-11 LAB — SARS CORONAVIRUS 2 (TAT 6-24 HRS): SARS Coronavirus 2: NEGATIVE

## 2019-11-14 ENCOUNTER — Ambulatory Visit: Payer: 59 | Admitting: Primary Care

## 2019-11-14 ENCOUNTER — Ambulatory Visit (INDEPENDENT_AMBULATORY_CARE_PROVIDER_SITE_OTHER): Payer: 59

## 2019-11-14 ENCOUNTER — Ambulatory Visit (INDEPENDENT_AMBULATORY_CARE_PROVIDER_SITE_OTHER): Payer: 59 | Admitting: Internal Medicine

## 2019-11-14 ENCOUNTER — Encounter: Payer: Self-pay | Admitting: Primary Care

## 2019-11-14 ENCOUNTER — Other Ambulatory Visit: Payer: Self-pay

## 2019-11-14 VITALS — BP 128/68 | HR 102 | Temp 97.8°F | Ht 63.0 in | Wt 143.0 lb

## 2019-11-14 DIAGNOSIS — J4530 Mild persistent asthma, uncomplicated: Secondary | ICD-10-CM | POA: Diagnosis not present

## 2019-11-14 DIAGNOSIS — R0602 Shortness of breath: Secondary | ICD-10-CM | POA: Diagnosis not present

## 2019-11-14 LAB — PULMONARY FUNCTION TEST
DL/VA % pred: 104 %
DL/VA: 4.85 ml/min/mmHg/L
DLCO cor % pred: 122 %
DLCO cor: 26.51 ml/min/mmHg
DLCO unc % pred: 122 %
DLCO unc: 26.51 ml/min/mmHg
FEF 25-75 Post: 2.1 L/sec
FEF 25-75 Pre: 3.78 L/sec
FEF2575-%Change-Post: -44 %
FEF2575-%Pred-Post: 60 %
FEF2575-%Pred-Pre: 109 %
FEV1-%Change-Post: -20 %
FEV1-%Pred-Post: 90 %
FEV1-%Pred-Pre: 113 %
FEV1-Post: 2.83 L
FEV1-Pre: 3.55 L
FEV1FVC-%Change-Post: -17 %
FEV1FVC-%Pred-Pre: 97 %
FEV6-%Change-Post: -3 %
FEV6-%Pred-Post: 114 %
FEV6-%Pred-Pre: 118 %
FEV6-Post: 4.15 L
FEV6-Pre: 4.3 L
FEV6FVC-%Pred-Post: 100 %
FEV6FVC-%Pred-Pre: 100 %
FVC-%Change-Post: -3 %
FVC-%Pred-Post: 113 %
FVC-%Pred-Pre: 117 %
FVC-Post: 4.15 L
FVC-Pre: 4.3 L
Post FEV1/FVC ratio: 68 %
Post FEV6/FVC ratio: 100 %
Pre FEV1/FVC ratio: 83 %
Pre FEV6/FVC Ratio: 100 %
RV % pred: 105 %
RV: 1.37 L
TLC % pred: 110 %
TLC: 5.4 L

## 2019-11-14 LAB — NITRIC OXIDE: Nitric Oxide: 17

## 2019-11-14 MED ORDER — BREO ELLIPTA 100-25 MCG/INH IN AEPB: 1.0000 | INHALATION_SPRAY | Freq: Every day | RESPIRATORY_TRACT | 0 refills | Status: DC

## 2019-11-14 NOTE — Progress Notes (Signed)
@Patient  ID: Ellen Whitney, female    DOB: 04-Nov-1990, 29 y.o.   MRN: [id number]  Chief Complaint  Patient presents with  . Follow-up    PFT follow up     Referring provider: Binnie Rail, MD  HPI: 29 year old female, never smoked (second hand exposure). PMH mild intermittent asthma, chronic bronchitis. Patient of Dr. Shearon Stalls, seen for initial consult on 08/16/19.  Previous [facility] pulmonary encounters: 08/16/19- Dr. Shearon Stalls Consult  2 weeks ago had episode of chest tightness and pain while barbequeing. Wonders if it was triggered by smoke. These symptoms last for minutes and resolve. She was given an inhaler and uses it less than once/week.  These usually improve her symptoms.  With these episodes she occasionally has sharp stabbing pain from her sternum to her shoulder that resolved spontaneously.  She gets a "respiratory infection" every year, usually around January.   Never been hospitalized for her breathing No breathing problems as a little kid. She does get dyspnea and coughing with exercise.   No rhinitis. No fevers, chills, night sweats.   She is taking OTC cetirizine at home.  She does have itching in her throat and postnasal drip.  She has tried taking Flonase occasionally.   11/14/2019- Interim history  Patient presents today for follow-up with PFTs. Feels out of breath when speaking at times. States that she has a random dry cough. She uses albuterol rescue inhaler once a week which does help. She is taking zyrtec daily as prescribed and using Flonase nasal spray as needed for nasal congestion. She is not consistently using omeprazole. Denies wheezing or chest tightness. She has had both covid vaccines.    Pulmonary Testing: 11/14/2019 PFTs- Pre Bronchodilator FVC 4.30 (117%), FEV1 3.55 (113%), ratio 83, TLC 110%, DLCOcor 26.51 (122%)  11/14/2019- FENO 17   Social History: Occupation: she is a Sales promotion account executive.  Exposures: Lives at home with her mom and sister.  Smoking  history: dad smoked at home. She is a never smoker, no vaping.     No Known Allergies   Immunization History  Administered Date(s) Administered  . Influenza,inj,Quad PF,6+ Mos 03/14/2015, 03/18/2017, 04/11/2019  . PFIZER SARS-COV-2 Vaccination 08/28/2019, 10/16/2019  . Tdap 03/14/2015    Past Medical History:  Diagnosis Date  . Allergy   . Anxiety   . Depression   . Eczema     Tobacco History: Social History   Tobacco Use  Smoking Status Never Smoker  Smokeless Tobacco Never Used   Counseling given: Not Answered   Outpatient Medications Prior to Visit  Medication Sig Dispense Refill  . albuterol (VENTOLIN HFA) 108 (90 Base) MCG/ACT inhaler Inhale 2 puffs into the lungs every 6 (six) hours as needed for wheezing or shortness of breath. 18 g 3  . cetirizine (ZYRTEC) 10 MG tablet Take 10 mg by mouth daily.    . fluticasone (FLONASE) 50 MCG/ACT nasal spray Place 2 sprays into both nostrils daily. 16 g 6  . omeprazole (PRILOSEC) 20 MG capsule Take in the morning 30 min before first meal of the day. 30 capsule 11   No facility-administered medications prior to visit.   Review of Systems  Review of Systems  Constitutional: Negative.   Respiratory: Positive for cough and shortness of breath. Negative for chest tightness and wheezing.   Cardiovascular: Negative for chest pain.   Physical Exam  BP 128/68 (BP Location: Left Arm, Cuff Size: Normal)   Pulse (!) 102   Temp 97.8 F (36.6 C) (  Temporal)   Ht 5\' 3"  (1.6 m)   Wt 143 lb (64.9 kg)   LMP 10/29/2019   SpO2 99%   BMI 25.33 kg/m  Physical Exam Constitutional:      Appearance: Normal appearance.  HENT:     Mouth/Throat:     Pharynx: Posterior oropharyngeal erythema present.     Tonsils: 3+ on the right. 3+ on the left.  Cardiovascular:     Rate and Rhythm: Regular rhythm. Tachycardia present.  Pulmonary:     Breath sounds: No wheezing or rales.     Comments: Pleural rub at bilateral bases  Neurological:      Mental Status: She is alert.      Lab Results:  CBC    Component Value Date/Time   WBC 9.6 04/11/2019 1456   RBC 4.73 04/11/2019 1456   HGB 14.6 04/11/2019 1456   HCT 44.0 04/11/2019 1456   PLT 282.0 04/11/2019 1456   MCV 93.0 04/11/2019 1456   MCH 31.2 08/01/2018 2248   MCHC 33.2 04/11/2019 1456   RDW 12.4 04/11/2019 1456   LYMPHSABS 3.8 04/11/2019 1456   MONOABS 0.7 04/11/2019 1456   EOSABS 0.4 04/11/2019 1456   BASOSABS 0.1 04/11/2019 1456    BMET    Component Value Date/Time   NA 138 04/11/2019 1456   K 5.4 No hemolysis seen (H) 04/11/2019 1456   CL 101 04/11/2019 1456   CO2 26 04/11/2019 1456   GLUCOSE 90 04/11/2019 1456   BUN 12 04/11/2019 1456   CREATININE 0.76 04/11/2019 1456   CALCIUM 10.8 (H) 04/11/2019 1456   GFRNONAA >60 08/01/2018 2248   GFRAA >60 08/01/2018 2248    BNP No results found for: BNP  ProBNP No results found for: PROBNP  Imaging: No results found.   Assessment & Plan:   Mild persistent asthma - Experiences shortness of breath with speaking and intermittent cough - Pulmonary function testing today showed evidence of hyperinflation - FENO was normal at 17  - Checking CXR and Labs (IgE and CBC with diff) - Recommend trial Breo 100 one puff daily (rinse mouth after use) - Adding Singulair 10mg  at bedtime  - Continue Zyrtec 10mg  daily and Flonase nasal spray  - Follow-up in 6 weeks with Dr. 09/30/2018 or APP       , NP 11/14/2019

## 2019-11-14 NOTE — Progress Notes (Signed)
Full PFT performed today. °

## 2019-11-14 NOTE — Assessment & Plan Note (Signed)
-   Experiences shortness of breath with speaking and intermittent cough - Pulmonary function testing today showed evidence of hyperinflation - FENO was normal at 17  - Checking CXR and Labs (IgE and CBC with diff) - Recommend trial Breo 100 one puff daily (rinse mouth after use) - Adding Singulair 10mg  at bedtime  - Continue Zyrtec 10mg  daily and Flonase nasal spray  - Follow-up in 6 weeks with Dr. or APP

## 2019-11-14 NOTE — Patient Instructions (Addendum)
Pulmonary function testing showed some evidence of hyperinflation which can be consistent with asthma  Orders: CXR, LABS and FENO  Recommendations: Trial Breo 100- TAKE ONE PUFF daily (rinse mouth after use) Adding Singulair which is used to treat and prevent asthma/allergies  Continue Zyrtec 10mg  daily Continue Flonase nasal spray   RX: Singulair 10mg  at bedtime  Follow-up: 6 weeks with Dr.    Montelukast oral tablets What is this medicine? MONTELUKAST (mon te LOO kast) is used to prevent and treat the symptoms of asthma. It is also used to treat allergies. Do not use for an acute asthma attack. This medicine may be used for other purposes; ask your health care provider or pharmacist if you have questions. COMMON BRAND NAME(S): Singulair What should I tell my health care provider before I take this medicine? They need to know if you have any of these conditions:  liver disease  an unusual or allergic reaction to montelukast, other medicines, foods, dyes, or preservatives  pregnant or trying to get pregnant  breast-feeding How should I use this medicine? This medicine should be given by mouth. Follow the directions on the prescription label. Take this medicine at the same time every day. You may take this medicine with or without meals. Do not chew the tablets. Do not stop taking your medicine unless your doctor tells you to. Talk to your pediatrician regarding the use of this medicine in children. Special care may be needed. While this drug may be prescribed for children as young as 66 years of age for selected conditions, precautions do apply. Overdosage: If you think you have taken too much of this medicine contact a poison control center or emergency room at once. NOTE: This medicine is only for you. Do not share this medicine with others. What if I miss a dose? If you miss a dose, skip it. Take your next dose at the normal time. Do not take extra or 2 doses at the  same time to make up for the missed dose. What may interact with this medicine?  anti-infectives like rifampin and rifabutin  medicines for seizures like phenytoin, phenobarbital, and carbamazepine This list may not describe all possible interactions. Give your health care provider a list of all the medicines, herbs, non-prescription drugs, or dietary supplements you use. Also tell them if you smoke, drink alcohol, or use illegal drugs. Some items may interact with your medicine. What should I watch for while using this medicine? Visit your doctor or health care professional for regular checks on your progress. Tell your doctor or health care professional if your allergy or asthma symptoms do not improve. Take your medicine even when you do not have symptoms. Do not stop taking any of your medicine(s) unless your doctor tells you to. If you have asthma, talk to your doctor about what to do in an acute asthma attack. Always have your inhaled rescue medicine for asthma attacks with you. Patients and their families should watch for new or worsening thoughts of suicide or depression. Also watch for sudden changes in feelings such as feeling anxious, agitated, panicky, irritable, hostile, aggressive, impulsive, severely restless, overly excited and hyperactive, or not being able to sleep. Any worsening of mood or thoughts of suicide or dying should be reported to your health care professional right away. What side effects may I notice from receiving this medicine? Side effects that you should report to your doctor or health care professional as soon as possible:  allergic reactions like  skin rash or hives, or swelling of the face, lips, or tongue  breathing problems  changes in emotions or moods  confusion  depressed mood  fever or infection  hallucinations  joint pain  painful lumps under the skin  pain, tingling, numbness in the hands or feet  redness, blistering, peeling, or loosening  of the skin, including inside the mouth  restlessness  seizures  sleep walking  signs and symptoms of infection like fever; chills; cough; sore throat; flu-like illness  signs and symptoms of liver injury like dark yellow or brown urine; general ill feeling or flu-like symptoms; light-colored stools; loss of appetite; nausea; right upper belly pain; unusually weak or tired; yellowing of the eyes or skin  sinus pain or swelling  stuttering  suicidal thoughts or other mood changes  tremors  trouble sleeping  uncontrolled muscle movements  unusual bleeding or bruising  vivid or bad dreams Side effects that usually do not require medical attention (report to your doctor or health care professional if they continue or are bothersome):  dizziness  drowsiness  headache  runny nose  stomach upset  tiredness This list may not describe all possible side effects. Call your doctor for medical advice about side effects. You may report side effects to FDA at 1-800-FDA-1088. Where should I keep my medicine? Keep out of the reach of children. Store at room temperature between 15 and 30 degrees C (59 and 86 degrees F). Protect from light and moisture. Keep this medicine in the original bottle. Throw away any unused medicine after the expiration date. NOTE: This sheet is a summary. It may not cover all possible information. If you have questions about this medicine, talk to your doctor, pharmacist, or health care provider.  2020 Elsevier/Gold Standard (2018-10-15 12:54:33)

## 2019-11-15 NOTE — Progress Notes (Signed)
Please let patient know CXR showed no evidence of Pneumonia or pleural effusion. She had some central airway thickening corresponding to asthma. Continue to recommend trial BREO inhaler to see it that helps her symptoms.

## 2019-11-16 ENCOUNTER — Other Ambulatory Visit (INDEPENDENT_AMBULATORY_CARE_PROVIDER_SITE_OTHER): Payer: 59

## 2019-11-16 ENCOUNTER — Other Ambulatory Visit: Payer: Self-pay | Admitting: Primary Care

## 2019-11-16 DIAGNOSIS — R0602 Shortness of breath: Secondary | ICD-10-CM

## 2019-11-16 LAB — CBC WITH DIFFERENTIAL/PLATELET
Basophils Absolute: 0.1 10*3/uL (ref 0.0–0.1)
Basophils Relative: 0.7 % (ref 0.0–3.0)
Eosinophils Absolute: 0.2 10*3/uL (ref 0.0–0.7)
Eosinophils Relative: 2.3 % (ref 0.0–5.0)
HCT: 42.3 % (ref 36.0–46.0)
Hemoglobin: 14.4 g/dL (ref 12.0–15.0)
Lymphocytes Relative: 38.4 % (ref 12.0–46.0)
Lymphs Abs: 3.7 10*3/uL (ref 0.7–4.0)
MCHC: 34 g/dL (ref 30.0–36.0)
MCV: 91.2 fl (ref 78.0–100.0)
Monocytes Absolute: 0.7 10*3/uL (ref 0.1–1.0)
Monocytes Relative: 6.9 % (ref 3.0–12.0)
Neutro Abs: 5 10*3/uL (ref 1.4–7.7)
Neutrophils Relative %: 51.7 % (ref 43.0–77.0)
Platelets: 281 10*3/uL (ref 150.0–400.0)
RBC: 4.64 Mil/uL (ref 3.87–5.11)
RDW: 12.4 % (ref 11.5–15.5)
WBC: 9.6 10*3/uL (ref 4.0–10.5)

## 2019-11-16 MED ORDER — BREO ELLIPTA 100-25 MCG/INH IN AEPB: 1.0000 | INHALATION_SPRAY | Freq: Every day | RESPIRATORY_TRACT | 5 refills | Status: DC

## 2019-11-16 NOTE — Progress Notes (Signed)
Patient identification verified. Results of recent CXR reviewed.  Per Buelah Manis NP,  please let patient know CXR showed no evidence of pneumonia or pleural effusion. She had some central airway thickening corresponding to asthma. Continue to recommend trial BREO inhaler to see it that helps her symptoms.   Patient verbalized understanding of results and requested script for Breo as the sample seems to be working and [place].  Script for [employer] sent to patient's pharmacy and NP for review.

## 2019-11-17 LAB — IGE: IgE (Immunoglobulin E), Serum: 99 kU/L (ref <=114)

## 2019-12-25 NOTE — Progress Notes (Signed)
Subjective:    Patient ID: Ellen Whitney, female    DOB: 03/06/1991, 29 y.o.   MRN: [id number]  HPI The patient is here for an acute visit.   Swollen ankle   She went hiking and she was skipping on uneven ground and twisted her right ankle (inverted it) and she fell.  She had a lot of pain.  She was hiking for hours.  It was swollen. This was two weeks ago.  She still has some swelling on the lateral aspect.  It hurts to invert her foot.  She did not get bruising.    She denies N/T.    Medications and allergies reviewed with patient and updated if appropriate.  Patient Active Problem List   Diagnosis Date Noted  . Mild persistent asthma 11/14/2019  . Cough 10/22/2018  . GERD (gastroesophageal reflux disease) 03/03/2018  . Palpitations 03/03/2018  . Trapezius muscle strain, right, initial encounter 03/03/2018  . Headache 03/03/2018  . Breast cyst, right 03/18/2017  . Epigastric pain 03/18/2017  . Hip flexor tendinitis, right 04/01/2016  . Nonallopathic lesion of lumbosacral region 04/01/2016  . Nonallopathic lesion of sacral region 04/01/2016  . 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 Poor posture 09/17/2015  . Nonallopathic lesion of cervical region 09/17/2015  . Nonallopathic lesion of thoracic region 09/17/2015  . Nonallopathic lesion-rib cage 09/17/2015  . Alopecia 08/27/2015  . Anxiety and depression 03/14/2015  . Seasonal allergies 03/14/2015  . Eczema 03/14/2015    Current Outpatient Medications on File Prior to Visit  Medication Sig Dispense Refill  . albuterol (VENTOLIN HFA) 108 (90 Base) MCG/ACT inhaler Inhale 2 puffs into the lungs every 6 (six) hours as needed for wheezing or shortness of breath. 18 g 3  . cetirizine (ZYRTEC) 10 MG tablet Take 10 mg by mouth daily.    . fluticasone (FLONASE) 50 MCG/ACT nasal spray Place 2 sprays into both nostrils daily. 16 g 6  . fluticasone furoate-vilanterol (BREO ELLIPTA) 100-25 MCG/INH AEPB Inhale 1 puff into the lungs  daily. 28 each 0  . fluticasone furoate-vilanterol (BREO ELLIPTA) 100-25 MCG/INH AEPB Inhale 1 puff into the lungs daily. 60 each 5  . omeprazole (PRILOSEC) 20 MG capsule Take in the morning 30 min before first meal of the day. 30 capsule 11   No current facility-administered medications on file prior to visit.    Past Medical History:  Diagnosis Date  . Allergy   . Anxiety   . Depression   . Eczema     Past Surgical History:  Procedure Laterality Date  . NO PAST SURGERIES      Social History   Socioeconomic History  . Marital status: Single    Spouse name: Not on file  . Number of children: 0  . Years of education: 50  . Highest education level: Not on file  Occupational History  . 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 Smoking status: Never Smoker  . Smokeless tobacco: Never Used  Substance and Sexual Activity  . Alcohol use: No    Alcohol/week: 0.0 standard drinks  . Drug use: No  . Sexual activity: Yes    Birth control/protection: None  Other Topics Concern  . Not on file  Social History Narrative   Denies religious beliefs effecting health care.   Denies abuse and feels safe where she lives.          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 Difficulty of Paying Living Expenses:   Food Insecurity:   . Worried About Programme researcher, broadcasting/film/video in the Last Year:   . Barista in the Last Year:   Transportation Needs:   . Freight forwarder (Medical):   Marland Kitchen Lack of Transportation (Non-Medical):   Physical Activity:   . Days of Exercise per Week:   . Minutes of Exercise per Session:   Stress:   . Feeling of Stress :   Social Connections:   . Frequency of Communication with Friends and Family:   . Frequency of Social Gatherings with Friends and Family:   . Attends Religious Services:   . Active  Member of Clubs or Organizations:   . Attends Banker Meetings:   Marland Kitchen Marital Status:     Family History  Problem Relation Age of Onset  . Colon cancer Maternal Grandmother   . Healthy Mother   . Healthy Father   . Thyroid disease Maternal Aunt   . Cancer Paternal Uncle        two great uncles with stomach or colon cancer  . Breast cancer Cousin   . Diabetes Other        runs in family, 2 sisters pre-diabetic    Review of Systems Per HPI    Objective:   Vitals:   12/26/19 1557  BP: 110/78  Pulse: 94  Temp: 98 F (36.7 C)  SpO2: 96%   BP Readings from Last 3 Encounters:  12/26/19 110/78  11/14/19 128/68  08/16/19 112/70   Wt Readings from Last 3 Encounters:  12/26/19 141 lb (64 kg)  11/14/19 143 lb (64.9 kg)  08/16/19 149 lb 12.8 oz (67.9 kg)   Body mass index is 24.98 kg/m.   Physical Exam Constitutional:      Appearance: Normal appearance. She is not ill-appearing.  Musculoskeletal:     Comments: Right ankle with minimal swelling lateral aspect-anterior to lateral malleolus.  Tenderness with palpation that is mild in the same area.  Mild pain with inversion of ankle.  No pain with flexion, extension or eversion.  Normal sensation and strength of foot.  Normal pulses of foot.  Skin:    General: Skin is warm and dry.     Findings: No bruising or erythema.  Neurological:     Mental Status: She is alert.            Assessment & Plan:    See Problem List for Assessment and Plan of chronic medical problems.    This visit occurred during the SARS-CoV-2 public health emergency.  Safety protocols were in place, including screening questions prior to the visit, additional usage of staff PPE, and extensive cleaning of exam room while observing appropriate contact time as indicated for disinfecting solutions.

## 2019-12-26 ENCOUNTER — Ambulatory Visit (INDEPENDENT_AMBULATORY_CARE_PROVIDER_SITE_OTHER)
Admission: RE | Admit: 2019-12-26 | Discharge: 2019-12-26 | Disposition: A | Discharge status: Home / Self Care | Payer: 59 | Source: Ambulatory Visit | Attending: Internal Medicine | Admitting: Internal Medicine

## 2019-12-26 ENCOUNTER — Encounter: Payer: Self-pay | Admitting: Internal Medicine

## 2019-12-26 ENCOUNTER — Ambulatory Visit: Payer: 59 | Admitting: Internal Medicine

## 2019-12-26 ENCOUNTER — Other Ambulatory Visit: Payer: Self-pay

## 2019-12-26 VITALS — BP 110/78 | HR 94 | Temp 98.0°F | Ht 63.0 in | Wt 141.0 lb

## 2019-12-26 DIAGNOSIS — S93491A Sprain of other ligament of right ankle, initial encounter: Secondary | ICD-10-CM

## 2019-12-26 NOTE — Assessment & Plan Note (Signed)
Acute visit  Ankle pain on lateral aspect after inversion injury Swelling and pain has improved, but still has some swelling and pain that are minimal We will go ahead and get an x-ray Advised revising activity, elevating when sitting, icing as needed and discussed that she can get an over-the-counter brace for some extra support Expect her symptoms to improve over the next few weeks, but advised the more active she is it may take longer She will call with any questions or concerns

## 2019-12-26 NOTE — Patient Instructions (Addendum)
Have an xray downstairs.   Revise your activities.  Elevate and ice the ankle as needed. You can wear a brace.    Ankle Sprain  An ankle sprain is a stretch or tear in a ligament in the ankle. Ligaments are tissues that connect bones to each other. The two most common types of ankle sprains are:  Inversion sprain. This happens when the foot turns inward and the ankle rolls outward. It affects the ligament on the outside of the foot (lateral ligament).  Eversion sprain. This happens when the foot turns outward and the ankle rolls inward. It affects the ligament on the inner side of the foot (medial ligament). What are the causes? This condition is often caused by accidentally rolling or twisting the ankle. What increases the risk? You are more likely to develop this condition if you play sports. What are the signs or symptoms? Symptoms of this condition include:  Pain in your ankle.  Swelling.  Bruising. This may develop right after you sprain your ankle or 1-2 days later.  Trouble standing or walking, especially when you turn or change directions. How is this diagnosed? This condition is diagnosed with:  A physical exam. During the exam, your health care provider will press on certain parts of your foot and ankle and try to move them in certain ways.  X-ray imaging. These may be taken to see how severe the sprain is and to check for broken bones. How is this treated? This condition may be treated with:  A brace or splint. This is used to keep the ankle from moving until it heals.  An elastic bandage. This is used to support the ankle.  Crutches.  Pain medicine.  Surgery. This may be needed if the sprain is severe.  Physical therapy. This may help to improve the range of motion in the ankle. Follow these instructions at home: If you have a brace or a splint:  Wear the brace or splint as told by your health care provider. Remove it only as told by your health care  provider.  Loosen the brace or splint if your toes tingle, become numb, or turn cold and blue.  Keep the brace or splint clean.  If the brace or splint is not waterproof: ? Do not let it get wet. ? Cover it with a watertight covering when you take a bath or a shower. If you have an elastic bandage (dressing):  Remove it to shower or bathe.  Try not to move your ankle much, but wiggle your toes from time to time. This helps to prevent swelling.  Adjust the dressing to make it more comfortable if it feels too tight.  Loosen the dressing if you have numbness or tingling in your foot, or if your foot becomes cold and blue. Managing pain, stiffness, and swelling   Take over-the-counter and prescription medicines only as told by your health care provider.  For 2-3 days, keep your ankle raised (elevated) above the level of your heart as much as possible.  If directed, put ice on the injured area: ? If you have a removable brace or splint, remove it as told by your health care provider. ? Put ice in a plastic bag. ? Place a towel between your skin and the bag. ? Leave the ice on for 20 minutes, 2-3 times a day. General instructions  Rest your ankle.  Do not use the injured limb to support your body weight until your health care provider says  that you can. Use crutches as told by your health care provider.  Do not use any products that contain nicotine or tobacco, such as cigarettes, e-cigarettes, and chewing tobacco. If you need help quitting, ask your health care provider.  Keep all follow-up visits as told by your health care provider. This is important. Contact a health care provider if:  You have rapidly increasing bruising or swelling.  Your pain is not relieved with medicine. Get help right away if:  Your foot or toes become numb or blue.  You have severe pain that gets worse. Summary  An ankle sprain is a stretch or tear in a ligament in the ankle. Ligaments are  tissues that connect bones to each other.  This condition is often caused by accidentally rolling or twisting the ankle.  Symptoms include pain, swelling, bruising, and trouble walking.  To relieve pain and swelling, put ice on the affected ankle, raise your ankle above the level of your heart, and use an elastic bandage.  Keep all follow-up visits as told by your health care provider. This is important. This information is not intended to replace advice given to you by your health care provider. Make sure you discuss any questions you have with your health care provider. Document Revised: 03/08/2018 Document Reviewed: 11/10/2017 Elsevier Patient Education  2020 [employer].

## 2020-01-20 ENCOUNTER — Telehealth: Payer: Self-pay | Admitting: Internal Medicine

## 2020-01-20 NOTE — Telephone Encounter (Signed)
New message:   Pt is calling and states she is experiencing r side abdominal pain for the last 2 days. I have transferred her to [facility].

## 2020-01-24 NOTE — Telephone Encounter (Signed)
Per [facility], Ellen Whitney states she developed right flank pain (Current pain rated as a 5 on the 1 to 10 scale) yesterday. No injury in the past 3 days. No fever, cold, cough, vomiting or diarrhea. She last passed urine last night. Alert and responsive.  Advised to see PCP within 24 hours. Predisposition urgent care.

## 2020-03-20 ENCOUNTER — Telehealth: Payer: Self-pay | Admitting: Internal Medicine

## 2020-03-20 DIAGNOSIS — J302 Other seasonal allergic rhinitis: Secondary | ICD-10-CM

## 2020-03-20 NOTE — Telephone Encounter (Signed)
Patient called and states that her allergies are really bad this year. Even with taking cetirizine (ZYRTEC) 10 MG tablet  And fluticasone (FLONASE) 50 MCG/ACT nasal spray  She was wondering if there was anything else that she could do.  Please call patient back [phone]

## 2020-03-21 MED ORDER — MONTELUKAST SODIUM 10 MG PO TABS: 10.0000 mg | ORAL_TABLET | Freq: Every day | ORAL | 5 refills | Status: AC

## 2020-03-21 NOTE — Addendum Note (Signed)
Addended by: Pincus Sanes on: 03/21/2020 08:58 PM   Modules accepted: Orders

## 2020-03-21 NOTE — Telephone Encounter (Signed)
Notified pt w/MD response. Pt states she would like rx for the Singulair and also want be referred to an allergist../lmb

## 2020-03-21 NOTE — Telephone Encounter (Signed)
We can try adding singulair to see if that helps - she may have already tried this in the past.  There is not much else to add.  I can refer her to an allergist if she want.s

## 2020-04-12 ENCOUNTER — Ambulatory Visit: Payer: 59 | Admitting: Allergy and Immunology

## 2020-04-16 NOTE — Patient Instructions (Addendum)
Blood work was ordered.    All other Health Maintenance issues reviewed.   All recommended immunizations and age-appropriate screenings are up-to-date or discussed.  Flu immunization administered today.    Medications reviewed and updated.  Changes include :  none    A referral was ordered for sports medicine.    Someone will call you to schedule an appointment.       Health Maintenance, Female Adopting a healthy lifestyle and getting preventive care are important in promoting health and wellness. Ask your health care provider about:  The right schedule for you to have regular tests and exams.  Things you can do on your own to prevent diseases and keep yourself healthy. What should I know about diet, weight, and exercise? Eat a healthy diet   Eat a diet that includes plenty of vegetables, fruits, low-fat dairy products, and lean protein.  Do not eat a lot of foods that are high in solid fats, added sugars, or sodium. Maintain a healthy weight Body mass index (BMI) is used to identify weight problems. It estimates body fat based on height and weight. Your health care provider can help determine your BMI and help you achieve or maintain a healthy weight. Get regular exercise Get regular exercise. This is one of the most important things you can do for your health. Most adults should:  Exercise for at least 150 minutes each week. The exercise should increase your heart rate and make you sweat (moderate-intensity exercise).  Do strengthening exercises at least twice a week. This is in addition to the moderate-intensity exercise.  Spend less time sitting. Even light physical activity can be beneficial. Watch cholesterol and blood lipids Have your blood tested for lipids and cholesterol at 28 years of age, then have this test every 5 years. Have your cholesterol levels checked more often if:  Your lipid or cholesterol levels are high.  You are older than 28 years of  age.  You are at high risk for heart disease. What should I know about cancer screening? Depending on your health history and family history, you may need to have cancer screening at various ages. This may include screening for:  Breast cancer.  Cervical cancer.  Colorectal cancer.  Skin cancer.  Lung cancer. What should I know about heart disease, diabetes, and high blood pressure? Blood pressure and heart disease  High blood pressure causes heart disease and increases the risk of stroke. This is more likely to develop in people who have high blood pressure readings, are of African descent, or are overweight.  Have your blood pressure checked: ? Every 3-5 years if you are 56-68 years of age. ? Every year if you are 27 years old or older. Diabetes Have regular diabetes screenings. This checks your fasting blood sugar level. Have the screening done:  Once every three years after age 40 if you are at a normal weight and have a low risk for diabetes.  More often and at a younger age if you are overweight or have a high risk for diabetes. What should I know about preventing infection? Hepatitis B If you have a higher risk for hepatitis B, you should be screened for this virus. Talk with your health care provider to find out if you are at risk for hepatitis B infection. Hepatitis C Testing is recommended for:  Everyone born from [birth year] through 1964.  Anyone with known risk factors for hepatitis C. Sexually transmitted infections (STIs)  Get screened for STIs, including  gonorrhea and chlamydia, if: ? You are sexually active and are younger than 28 years of age. ? You are older than 28 years of age and your health care provider tells you that you are at risk for this type of infection. ? Your sexual activity has changed since you were last screened, and you are at increased risk for chlamydia or gonorrhea. Ask your health care provider if you are at risk.  Ask your health care  provider about whether you are at high risk for HIV. Your health care provider may recommend a prescription medicine to help prevent HIV infection. If you choose to take medicine to prevent HIV, you should first get tested for HIV. You should then be tested every 3 months for as long as you are taking the medicine. Pregnancy  If you are about to stop having your period (premenopausal) and you may become pregnant, seek counseling before you get pregnant.  Take 400 to 800 micrograms (mcg) of folic acid every day if you become pregnant.  Ask for birth control (contraception) if you want to prevent pregnancy. Osteoporosis and menopause Osteoporosis is a disease in which the bones lose minerals and strength with aging. This can result in bone fractures. If you are 2 years old or older, or if you are at risk for osteoporosis and fractures, ask your health care provider if you should:  Be screened for bone loss.  Take a calcium or vitamin D supplement to lower your risk of fractures.  Be given hormone replacement therapy (HRT) to treat symptoms of menopause. Follow these instructions at home: Lifestyle  Do not use any products that contain nicotine or tobacco, such as cigarettes, e-cigarettes, and chewing tobacco. If you need help quitting, ask your health care provider.  Do not use street drugs.  Do not share needles.  Ask your health care provider for help if you need support or information about quitting drugs. Alcohol use  Do not drink alcohol if: ? Your health care provider tells you not to drink. ? You are pregnant, may be pregnant, or are planning to become pregnant.  If you drink alcohol: ? Limit how much you use to 0-1 drink a day. ? Limit intake if you are breastfeeding.  Be aware of how much alcohol is in your drink. In the [place] one drink equals one 12 oz bottle of beer (355 mL), one 5 oz glass of wine (148 mL), or one 1 oz glass of hard liquor (44 mL). General  instructions  Schedule regular health, dental, and eye exams.  Stay current with your vaccines.  Tell your health care provider if: ? You often feel depressed. ? You have ever been abused or do not feel safe at home. Summary  Adopting a healthy lifestyle and getting preventive care are important in promoting health and wellness.  Follow your health care provider's instructions about healthy diet, exercising, and getting tested or screened for diseases.  Follow your health care provider's instructions on monitoring your cholesterol and blood pressure. This information is not intended to replace advice given to you by your health care provider. Make sure you discuss any questions you have with your health care provider. Document Revised: 06/09/2018 Document Reviewed: 06/09/2018 Elsevier Patient Education  2020 [employer].

## 2020-04-16 NOTE — Progress Notes (Signed)
Subjective:    Patient ID: Ellen Whitney, female    DOB: [birth year], 29 y.o.   MRN: [id number]   This visit occurred during the SARS-CoV-2 public health emergency.  Safety protocols were in place, including screening questions prior to the visit, additional usage of staff PPE, and extensive cleaning of exam room while observing appropriate contact time as indicated for disinfecting solutions.    HPI She is here for a physical exam.     Medications and allergies reviewed with patient and updated if appropriate.  Patient Active Problem List   Diagnosis Date Noted  . Sprain of anterior talofibular ligament of right ankle 12/26/2019  . Mild persistent asthma 11/14/2019  . GERD (gastroesophageal reflux disease) 03/03/2018  . Palpitations 03/03/2018  . Breast cyst, right 03/18/2017  . Hip flexor tendinitis, right 04/01/2016  . Nonallopathic lesion of lumbosacral region 04/01/2016  . Nonallopathic lesion of sacral region 04/01/2016  . 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 Nonallopathic lesion of cervical region 09/17/2015  . Nonallopathic lesion of thoracic region 09/17/2015  . Nonallopathic lesion-rib cage 09/17/2015  . Alopecia 08/27/2015  . Seasonal allergies 03/14/2015  . Eczema 03/14/2015    Current Outpatient Medications on File Prior to Visit  Medication Sig Dispense Refill  . albuterol (VENTOLIN HFA) 108 (90 Base) MCG/ACT inhaler Inhale 2 puffs into the lungs every 6 (six) hours as needed for wheezing or shortness of breath. 18 g 3  . cetirizine (ZYRTEC) 10 MG tablet Take 10 mg by mouth daily.    . fluticasone (FLONASE) 50 MCG/ACT nasal spray Place 2 sprays into both nostrils daily. 16 g 6  . fluticasone furoate-vilanterol (BREO ELLIPTA) 100-25 MCG/INH AEPB Inhale 1 puff into the lungs daily. 60 each 5  . montelukast (SINGULAIR) 10 MG tablet Take 1 tablet (10 mg total) by mouth at bedtime. 30 tablet 5   No current facility-administered medications on file prior to visit.      Past Medical History:  Diagnosis Date  . Allergy   . Anxiety   . Depression   . Eczema     Past Surgical History:  Procedure Laterality Date  . NO PAST SURGERIES      Social History   Socioeconomic History  . Marital status: Single    Spouse name: Not on file  . Number of children: 0  . Years of education: 38  . Highest education level: Not on file  Occupational History  . 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 Smoking status: Never Smoker  . Smokeless tobacco: Never Used  Substance and Sexual Activity  . Alcohol use: No    Alcohol/week: 0.0 standard drinks  . Drug use: No  . Sexual activity: Yes    Birth control/protection: None  Other Topics Concern  . Not on file  Social History Narrative   Denies religious beliefs effecting health care.   Denies abuse and feels safe where she lives.          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 Difficulty of Paying Living Expenses: Not on file  Food Insecurity:   . Worried About Programme researcher, broadcasting/film/video in the Last Year: Not on file  . Ran Out of Food in the Last Year: Not on file  Transportation Needs:   . Lack of  Transportation (Medical): Not on file  . Lack of Transportation (Non-Medical): Not on file  Physical Activity:   . Days of Exercise per Week: Not on file  . Minutes of Exercise per Session: Not on file  Stress:   . Feeling of Stress : Not on file  Social Connections:   . Frequency of Communication with Friends and Family: Not on file  . Frequency of Social Gatherings with Friends and Family: Not on file  . Attends Religious Services: Not on file  . Active Member of Clubs or Organizations: Not on file  . Attends Banker Meetings: Not on file  . Marital Status: Not on file    Family History  Problem Relation Age of Onset  . Colon  cancer Maternal Grandmother   . Healthy Mother   . Healthy Father   . Thyroid disease Maternal Aunt   . Cancer Paternal Uncle        two great uncles with stomach or colon cancer  . Breast cancer Cousin   . Diabetes Other        runs in family, 2 sisters pre-diabetic    Review of Systems  Constitutional: Negative for chills and fever.  Eyes: Negative for visual disturbance.  Respiratory: Negative for cough, shortness of breath and wheezing.   Cardiovascular: Negative for chest pain, palpitations and leg swelling.  Gastrointestinal: Negative for abdominal pain, blood in stool, constipation, diarrhea and nausea.  Genitourinary: Negative for dysuria and hematuria.  Musculoskeletal: Positive for back pain. Negative for arthralgias.  Skin: Positive for rash (eczema).  Neurological: Negative for light-headedness and headaches.  Psychiatric/Behavioral: Negative for dysphoric mood. The patient is not nervous/anxious.        Objective:   Vitals:   04/17/20 0916  BP: 108/72  Pulse: 78  Temp: 98 F (36.7 C)  SpO2: 98%   Filed Weights   04/17/20 0916  Weight: 143 lb (64.9 kg)   Body mass index is 25.33 kg/m.  BP Readings from Last 3 Encounters:  04/17/20 108/72  12/26/19 110/78  11/14/19 128/68    Wt Readings from Last 3 Encounters:  04/17/20 143 lb (64.9 kg)  12/26/19 141 lb (64 kg)  11/14/19 143 lb (64.9 kg)     Physical Exam Constitutional: She appears well-developed and well-nourished. No distress.  HENT:  Head: Normocephalic and atraumatic.  Right Ear: External ear normal. Normal ear canal and TM Left Ear: External ear normal.  Normal ear canal and TM Mouth/Throat: Oropharynx is clear and moist.  Eyes: Conjunctivae and EOM are normal.  Neck: Neck supple. No tracheal deviation present. No thyromegaly present.  No carotid bruit  Cardiovascular: Normal rate, regular rhythm and normal heart sounds.   No murmur heard.  No edema. Pulmonary/Chest: Effort normal and  breath sounds normal. No respiratory distress. She has no wheezes. She has no rales.  Breast: deferred   Abdominal: Soft. She exhibits no distension. There is no tenderness.  Lymphadenopathy: She has no cervical adenopathy.  Skin: Skin is warm and dry. She is not diaphoretic.  Psychiatric: She has a normal mood and affect. Her behavior is normal.        Assessment & Plan:   Physical exam: Screening blood work    ordered Immunizations  Flu vaccine today, tdap up to date Gyn  Up to date  Exercise  Going to gym 6 days a week Weight  Working on weight loss Substance abuse  none      See Problem List for  Assessment and Plan of chronic medical problems.

## 2020-04-17 ENCOUNTER — Other Ambulatory Visit: Payer: Self-pay

## 2020-04-17 ENCOUNTER — Encounter: Payer: Self-pay | Admitting: Internal Medicine

## 2020-04-17 ENCOUNTER — Ambulatory Visit (INDEPENDENT_AMBULATORY_CARE_PROVIDER_SITE_OTHER): Payer: 59 | Admitting: Internal Medicine

## 2020-04-17 VITALS — BP 108/72 | HR 78 | Temp 98.0°F | Ht 63.0 in | Wt 143.0 lb

## 2020-04-17 DIAGNOSIS — Z23 Encounter for immunization: Secondary | ICD-10-CM

## 2020-04-17 DIAGNOSIS — K219 Gastro-esophageal reflux disease without esophagitis: Secondary | ICD-10-CM

## 2020-04-17 DIAGNOSIS — Z0000 Encounter for general adult medical examination without abnormal findings: Secondary | ICD-10-CM | POA: Diagnosis not present

## 2020-04-17 DIAGNOSIS — Z1159 Encounter for screening for other viral diseases: Secondary | ICD-10-CM | POA: Diagnosis not present

## 2020-04-17 DIAGNOSIS — M549 Dorsalgia, unspecified: Secondary | ICD-10-CM

## 2020-04-17 DIAGNOSIS — J4530 Mild persistent asthma, uncomplicated: Secondary | ICD-10-CM

## 2020-04-17 LAB — CBC WITH DIFFERENTIAL/PLATELET
Basophils Absolute: 0.1 10*3/uL (ref 0.0–0.1)
Basophils Relative: 0.8 % (ref 0.0–3.0)
Eosinophils Absolute: 0.2 10*3/uL (ref 0.0–0.7)
Eosinophils Relative: 2.6 % (ref 0.0–5.0)
HCT: 41.2 % (ref 36.0–46.0)
Hemoglobin: 13.9 g/dL (ref 12.0–15.0)
Lymphocytes Relative: 46.3 % — ABNORMAL HIGH (ref 12.0–46.0)
Lymphs Abs: 3 10*3/uL (ref 0.7–4.0)
MCHC: 33.7 g/dL (ref 30.0–36.0)
MCV: 93.9 fl (ref 78.0–100.0)
Monocytes Absolute: 0.6 10*3/uL (ref 0.1–1.0)
Monocytes Relative: 8.6 % (ref 3.0–12.0)
Neutro Abs: 2.7 10*3/uL (ref 1.4–7.7)
Neutrophils Relative %: 41.7 % — ABNORMAL LOW (ref 43.0–77.0)
Platelets: 245 10*3/uL (ref 150.0–400.0)
RBC: 4.39 Mil/uL (ref 3.87–5.11)
RDW: 12.7 % (ref 11.5–15.5)
WBC: 6.5 10*3/uL (ref 4.0–10.5)

## 2020-04-17 LAB — COMPREHENSIVE METABOLIC PANEL
ALT: 12 U/L (ref 0–35)
AST: 18 U/L (ref 0–37)
Albumin: 4.5 g/dL (ref 3.5–5.2)
Alkaline Phosphatase: 46 U/L (ref 39–117)
BUN: 14 mg/dL (ref 6–23)
CO2: 28 mEq/L (ref 19–32)
Calcium: 9.7 mg/dL (ref 8.4–10.5)
Chloride: 103 mEq/L (ref 96–112)
Creatinine, Ser: 0.8 mg/dL (ref 0.40–1.20)
GFR: 99.94 mL/min (ref >60.00)
Glucose, Bld: 92 mg/dL (ref 70–99)
Potassium: 4.8 mEq/L (ref 3.5–5.1)
Sodium: 138 mEq/L (ref 135–145)
Total Bilirubin: 0.5 mg/dL (ref 0.2–1.2)
Total Protein: 7.8 g/dL (ref 6.0–8.3)

## 2020-04-17 LAB — LIPID PANEL
Cholesterol: 186 mg/dL (ref 0–200)
HDL: 55.1 mg/dL (ref >39.00)
LDL Cholesterol: 120 mg/dL — ABNORMAL HIGH (ref 0–99)
NonHDL: 130.87
Total CHOL/HDL Ratio: 3
Triglycerides: 54 mg/dL (ref 0.0–149.0)
VLDL: 10.8 mg/dL (ref 0.0–40.0)

## 2020-04-17 LAB — TSH: TSH: 1.35 u[IU]/mL (ref 0.35–4.50)

## 2020-04-17 MED ORDER — ONE-A-DAY MENS PO TABS: 1.0000 | ORAL_TABLET | Freq: Every day | ORAL | 0 refills | Status: AC

## 2020-04-17 NOTE — Addendum Note (Signed)
Addended by: Merrilyn Puma on: 04/17/2020 09:56 AM   Modules accepted: Orders

## 2020-04-17 NOTE — Assessment & Plan Note (Signed)
Chronic Flares with allergies Uses Breo daily and albuterol prn Taking zyrtec daily, flonase as needed and singulair daily Overall controlled, continue current meds

## 2020-04-17 NOTE — Assessment & Plan Note (Signed)
Chronic Adjusted her diet - does not eat after 6pm, working on weight loss Ran out of omeprazole Taking tums prn - only once in a while

## 2020-04-17 NOTE — Addendum Note (Signed)
Addended by: Karma Ganja on: 04/17/2020 04:38 PM   Modules accepted: Orders

## 2020-04-18 LAB — HEPATITIS C ANTIBODY
Hepatitis C Ab: NONREACTIVE
SIGNAL TO CUT-OFF: 0.03 (ref <1.00)

## 2020-04-24 ENCOUNTER — Encounter: Payer: Self-pay | Admitting: Internal Medicine

## 2020-04-24 NOTE — Progress Notes (Signed)
Ellen Whitney [address] [place] [address] Phone: [phone] Subjective:   Ellen Whitney, am serving as a scribe for Dr. Antoine Primas. This visit occurred during the SARS-CoV-2 public health emergency.  Safety protocols were in place, including screening questions prior to the visit, additional usage of staff PPE, and extensive cleaning of exam room while observing appropriate contact time as indicated for disinfecting solutions.  I'm seeing this patient by the request  of:  Ellen Sanes, MD  CC: Neck and back pain   [id number]  Ellen Whitney is a 29 y.o. female coming in with complaint of back and neck pain. OMT 04/01/2016. Patient states that she has pain with rotation to the right and shoulder flexion for one month. Using topical analgesics. Does work out daily.  Patient states it never stops her from working out but seems to get worse with any overhead activity.  Denies any nighttime wakening.  Not taking any medications regularly for it.          Reviewed prior external information including notes and imaging from previsou exam, outside providers and external EMR if available.   As well as notes that were available from care everywhere and other healthcare systems.  Past medical history, social, surgical and family history all reviewed in electronic medical record.  No pertanent information unless stated regarding to the chief complaint.   Past Medical History:  Diagnosis Date  . Allergy   . Anxiety   . Depression   . Eczema     No Known Allergies   Review of Systems:  No headache, visual changes, nausea, vomiting, diarrhea, constipation, dizziness, abdominal pain, skin rash, fevers, chills, night sweats, weight loss, swollen lymph nodes, body aches, joint swelling, chest pain, shortness of breath, mood changes. POSITIVE muscle aches  Objective  Blood pressure 104/70, pulse 83, height 5\' 3"  (1.6 m), weight 142 lb (64.4 kg),  SpO2 97 %.   General: No apparent distress alert and oriented x3 mood and affect normal, dressed appropriately.  HEENT: Pupils equal, extraocular movements intact  Respiratory: Patient's speak in full sentences and does not appear short of breath  Cardiovascular: No lower extremity edema, non tender, no erythema  Neuro: Cranial nerves II through XII are intact, neurovascularly intact in all extremities with 2+ DTRs and 2+ pulses.  Gait normal with good balance and coordination.  MSK:  Non tender with full range of motion and good stability and symmetric strength and tone of , elbows, wrist, hip, knee and ankles bilaterally.  Right shoulder exam shows the patient does have very mild positive impingement.  Mild positive O'Brien's.  Patient does have full range of motion and full strength of the rotator cuff. Patient has some tightness noted in the parascapular region right greater than left.  Patient does have a couple trigger points noted.  Mild scapular dyskinesis on the right side noted.  Osteopathic findings  C6 flexed rotated and side bent right T3 extended rotated and side bent right inhaled rib T8 extended rotated and side bent left L2 flexed rotated and side bent right Sacrum left on left     [id number]; 15 additional minutes spent for Therapeutic exercises as stated in above notes.  This included exercises focusing on stretching, strengthening, with significant focus on eccentric aspects.   Long term goals include an improvement in range of motion, strength, endurance as well as avoiding reinjury. Patient's frequency would include in 1-2 times a day, 3-5 times a  week for a duration of 6-12 weeks. Exercises that included:  Basic scapular stabilization to include adduction and depression of scapula Scaption, focusing on proper movement and good control Internal and External rotation utilizing a theraband, with elbow tucked at side entire time Rows with theraband   Proper technique shown and  discussed handout in great detail with ATC.  All questions were discussed and answered.      Assessment and Plan:    Nonallopathic problems  Decision today to treat with OMT was based on Physical Exam  After verbal consent patient was treated with HVLA, ME, FPR techniques in cervical, rib, thoracic, lumbar, and sacral  areas  Patient tolerated the procedure well with improvement in symptoms  Patient given exercises, stretches and lifestyle modifications  See medications in patient instructions if given  Patient will follow up in 4-8 weeks      The above documentation has been reviewed and is accurate and complete Ellen Saa, DO       Note: This dictation was prepared with Dragon dictation along with smaller phrase technology. Any transcriptional errors that result from this process are unintentional.

## 2020-04-25 ENCOUNTER — Ambulatory Visit (INDEPENDENT_AMBULATORY_CARE_PROVIDER_SITE_OTHER): Payer: 59 | Admitting: Family Medicine

## 2020-04-25 ENCOUNTER — Encounter: Payer: Self-pay | Admitting: Family Medicine

## 2020-04-25 ENCOUNTER — Other Ambulatory Visit: Payer: Self-pay

## 2020-04-25 VITALS — BP 104/70 | HR 83 | Ht 63.0 in | Wt 142.0 lb

## 2020-04-25 DIAGNOSIS — M899 Disorder of bone, unspecified: Secondary | ICD-10-CM

## 2020-04-25 DIAGNOSIS — M999 Biomechanical lesion, unspecified: Secondary | ICD-10-CM | POA: Diagnosis not present

## 2020-04-25 NOTE — Assessment & Plan Note (Signed)
Patient has had scapular dysfunction previously and does have some dyskinesis noted again.  Likely some secondary to fatigue.  Patient did have some mild soft findings that could be consistent with labral pathology of the shoulder but good range of motion.  Patient responded well to osteopathic manipulation.  Patient has not taken any medications for the pain and I do not think anything needs to be prescribed at this time.  Increase activity slowly.  Work with Event organiser to learn exercises follow-up again in 4 to 8 weeks

## 2020-04-25 NOTE — Patient Instructions (Signed)
Scapular exercises See me again in 6 weeks Keep hands in peripheral vision

## 2020-04-30 MED ORDER — DRYSOL 20 % EX SOLN: CUTANEOUS | 5 refills | Status: AC

## 2020-05-08 ENCOUNTER — Telehealth: Payer: Self-pay | Admitting: Internal Medicine

## 2020-05-08 NOTE — Telephone Encounter (Signed)
Pt notified and understood

## 2020-05-08 NOTE — Telephone Encounter (Signed)
  Seeking advice  Patient calling to report she has pain under her arm. She feels it is coming from the covid booster she received on Sunday. No fever, just painful. Declined appointment at this time

## 2020-05-08 NOTE — Telephone Encounter (Signed)
After the covid booster it is common to get swollen lymph nodes in the armpit - this is likely the cause - this will go down, but may take a few weeks.

## 2020-05-29 ENCOUNTER — Ambulatory Visit: Payer: 59 | Admitting: Allergy & Immunology

## 2020-05-30 ENCOUNTER — Encounter: Payer: Self-pay | Admitting: Internal Medicine

## 2020-05-31 MED ORDER — FLUCONAZOLE 150 MG PO TABS: 150.0000 mg | ORAL_TABLET | Freq: Once | ORAL | 0 refills | Status: AC

## 2020-05-31 NOTE — Addendum Note (Signed)
Addended by: Pincus Sanes on: 05/31/2020 09:57 AM   Modules accepted: Orders

## 2020-06-03 ENCOUNTER — Other Ambulatory Visit: Payer: Self-pay | Admitting: Primary Care

## 2021-11-03 IMAGING — DX DG ANKLE COMPLETE 3+V*R*
3 series · 3 of 3 positions shown · non-contrast
Comparison: None.

CLINICAL DATA: Pain on the lateral aspect

EXAM:
RIGHT ANKLE - COMPLETE 3+ VIEW

[ankle ap]
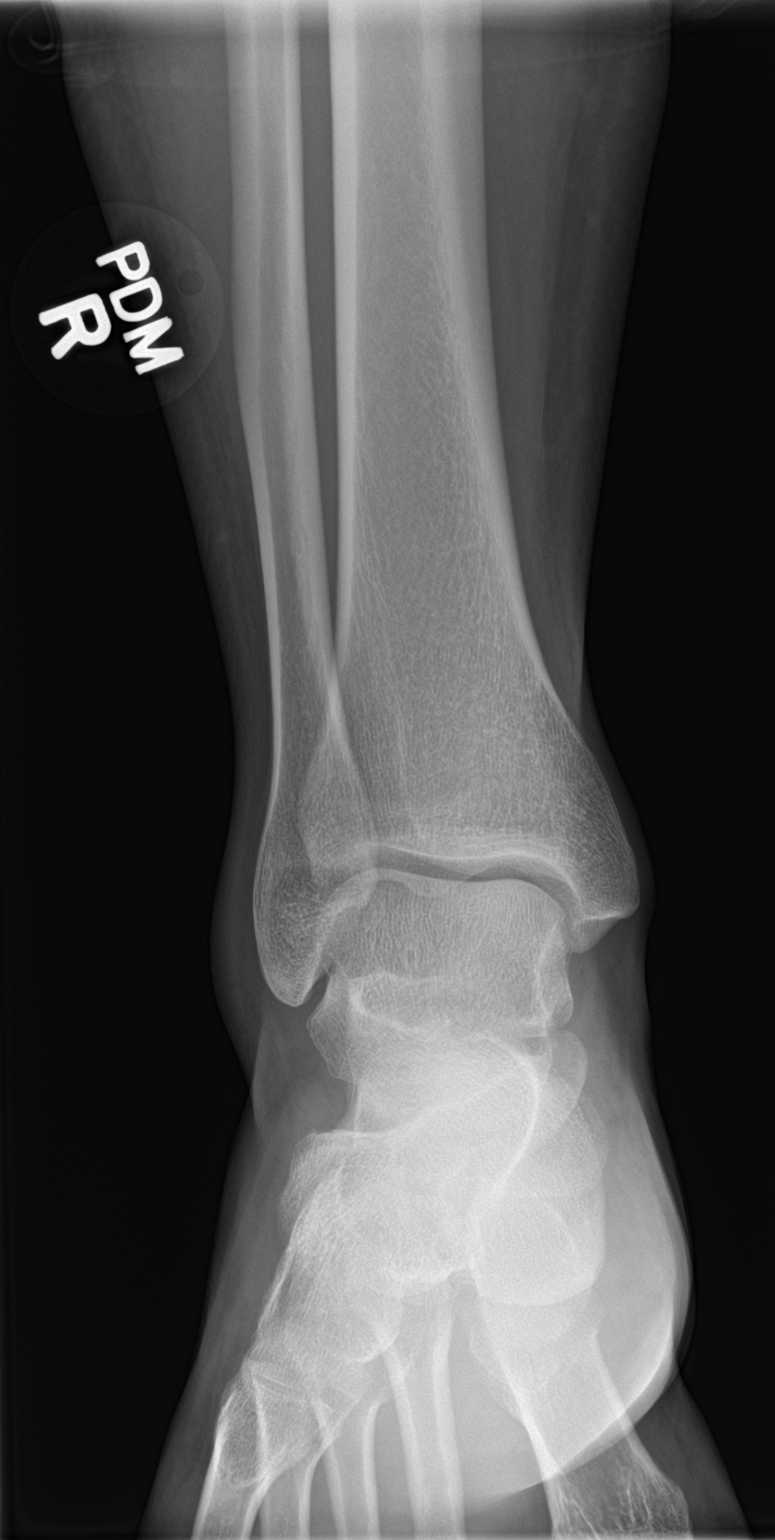

[ankle obl]
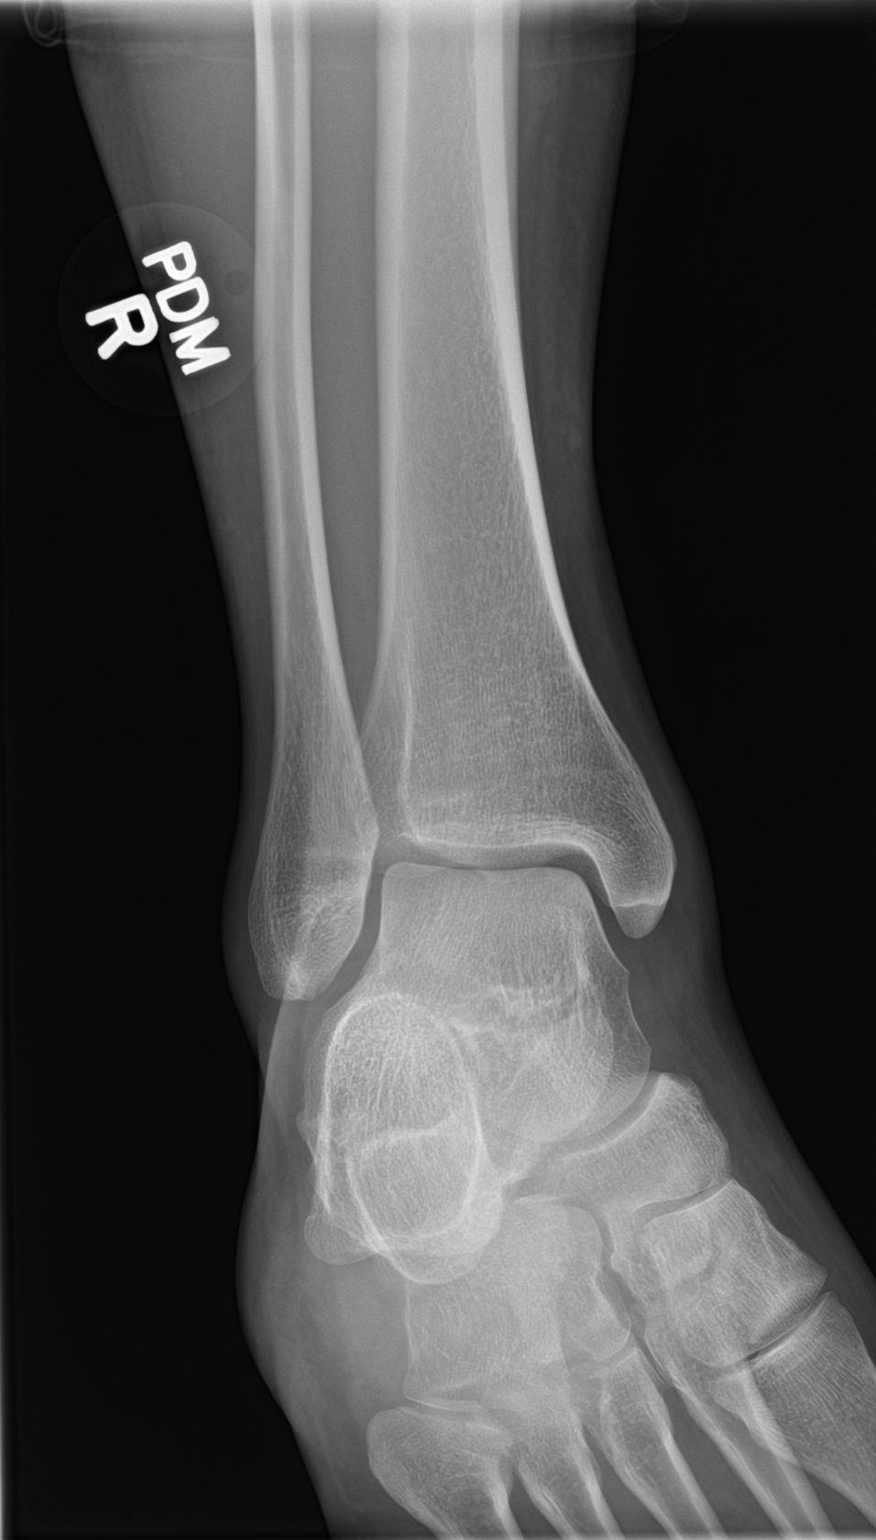

[ankle lat]
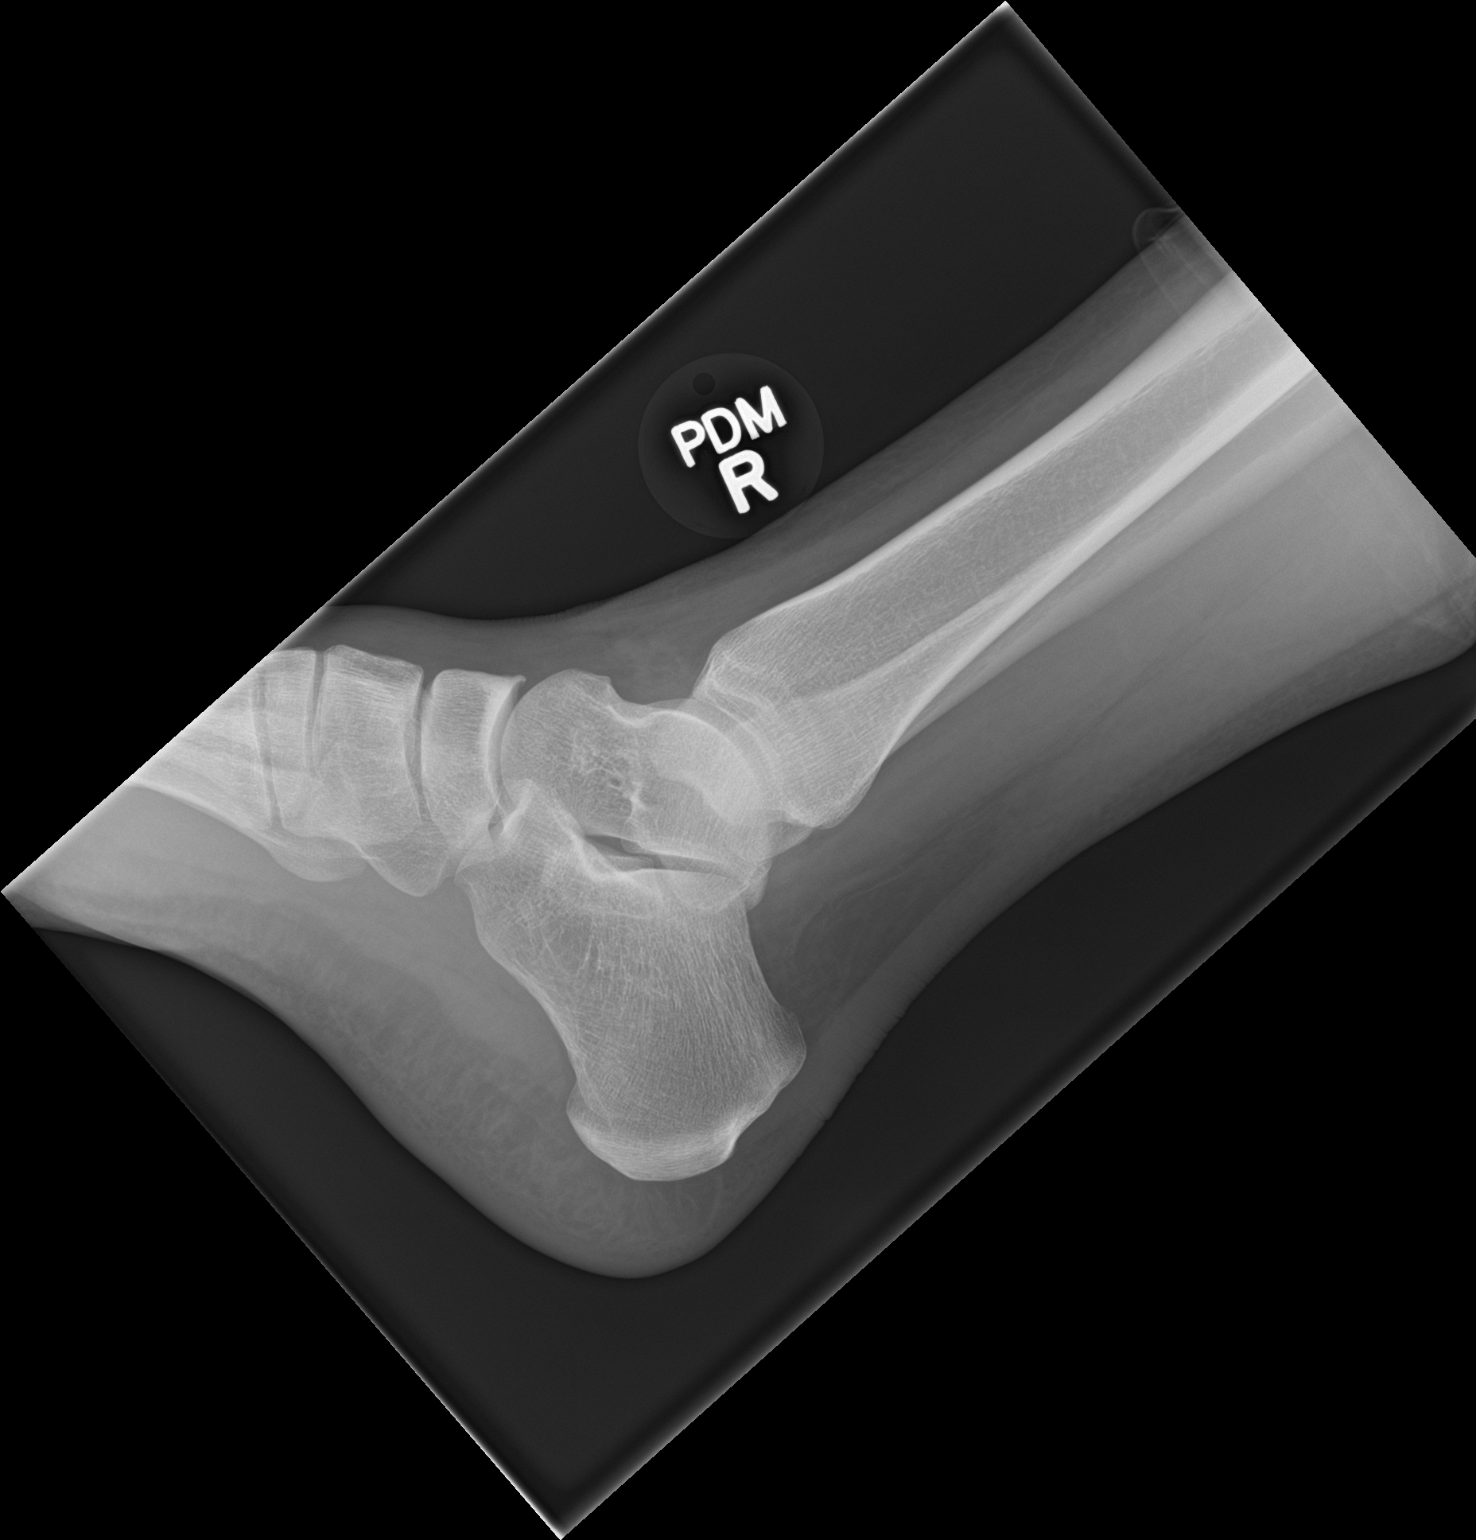

[3 of 3 positions shown; findings below may reference images not displayed]

FINDINGS: There is no evidence of fracture, dislocation, or joint effusion.
There is no evidence of arthropathy or other focal bone abnormality.
Soft tissues are unremarkable.
IMPRESSION: Negative.
